# Patient Record
Sex: Male | Born: 1990 | Race: White | Hispanic: No | Marital: Single | Smoking: Current every day smoker
Health system: Southern US, Community
[De-identification: ages and names within clinical notes are randomized; demographics above are authoritative.]

## PROBLEM LIST (undated history)

## (undated) DIAGNOSIS — Z862 Personal history of diseases of the blood and blood-forming organs and certain disorders involving the immune mechanism: Secondary | ICD-10-CM

## (undated) DIAGNOSIS — S83249A Other tear of medial meniscus, current injury, unspecified knee, initial encounter: Secondary | ICD-10-CM

## (undated) HISTORY — PX: KNEE ARTHROSCOPY: SHX127

---
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
## 2010-08-02 ENCOUNTER — Encounter (HOSPITAL_COMMUNITY)
Admission: RE | Admit: 2010-08-02 | Discharge: 2010-08-02 | Disposition: A | Discharge status: Home / Self Care | Payer: 59 | Source: Ambulatory Visit | Attending: General Surgery | Admitting: General Surgery

## 2010-08-02 LAB — SURGICAL PCR SCREEN
MRSA, PCR: NEGATIVE
Staphylococcus aureus: NEGATIVE

## 2010-08-03 ENCOUNTER — Other Ambulatory Visit: Payer: Self-pay | Admitting: General Surgery

## 2010-08-03 ENCOUNTER — Ambulatory Visit (HOSPITAL_COMMUNITY)
Admission: RE | Admit: 2010-08-03 | Discharge: 2010-08-03 | Disposition: A | Discharge status: Home / Self Care | Payer: 59 | Source: Ambulatory Visit | Attending: General Surgery | Admitting: General Surgery

## 2010-08-03 DIAGNOSIS — L0591 Pilonidal cyst without abscess: Secondary | ICD-10-CM | POA: Insufficient documentation

## 2010-08-03 DIAGNOSIS — F17200 Nicotine dependence, unspecified, uncomplicated: Secondary | ICD-10-CM | POA: Insufficient documentation

## 2010-08-03 DIAGNOSIS — Z01812 Encounter for preprocedural laboratory examination: Secondary | ICD-10-CM | POA: Insufficient documentation

## 2010-08-03 HISTORY — PX: PILONIDAL CYST EXCISION: SHX744

## 2010-08-17 ENCOUNTER — Encounter (INDEPENDENT_AMBULATORY_CARE_PROVIDER_SITE_OTHER): Payer: Self-pay | Admitting: General Surgery

## 2010-08-17 NOTE — Op Note (Signed)
  NAMEBURTIS, Daryl Mejia                ACCOUNT NO.:  [id number]  MEDICAL RECORD NO.:  [id number]  LOCATION:  [facility]                         FACILITY:  [facility]  PHYSICIAN:  Cherylynn Ridges, M.D.    DATE OF BIRTH:  [birth year]  DATE OF PROCEDURE:  08/03/2010 DATE OF DISCHARGE:  08/03/2010                              OPERATIVE REPORT   PREOPERATIVE DIAGNOSIS:  Pilonidal cyst disease.  POSTOPERATIVE DIAGNOSIS:  Pilonidal cyst disease.  PROCEDURE:  Excision of pilonidal cyst disease.  SURGEON:  Cherylynn Ridges, MD  ANESTHESIA:  General endotracheal.  ESTIMATED BLOOD LOSS:  Less than 20 mL.  COMPLICATIONS:  None.  CONDITION:  Stable.  FINDINGS:  None.  DRAINING:  Pilonidal cyst disease in the upper intragluteal fold  INDICATIONS FOR OPERATION:  The patient is a 20 year old who has a symptomatic and previously-draining pilonidal cyst who now comes in for elective excision.  OPERATION:  The patient was taken to the operating room, placed on table in a supine position.  After an adequate general endotracheal anesthetic was administered, he was placed in the prone position and prepped and draped in usual sterile manner having his cheek separated by tape.  We marked the area of excision with a marking pen, then made our oval incision around the cystic area in the upper gluteal fold down below the lowest orifice leading into the cyst.  We excised it into the subcutaneous tissue using #10 blade. We used electrocautery to excise it circumferentially down to the presacral fascia.  This was done with minimal difficulty.  We did not enter the cyst cavity itself.  After it was removed on a separate field, we passed a probe into the opening into the cyst confirming its patency into the cyst.  We obtained hemostasis with electrocautery.  We irrigated with saline. We reapproximated the wound deeply subcutaneously with 2-0 Vicryl sutures, was superficially with 3-0 Vicryl, then the skin was  closed using a vertical mattress stitches of 2-0 nylon.  Marcaine 0.5% with epi was injected into the wound.  Prior to closure, all counts were correct. A sterile dressing was applied using Betadine ointment, 4x4s and an ABD.     Cherylynn Ridges, M.D.     JOW/MEDQ  D:  08/03/2010  T:  08/04/2010  Job:  [id number]  Electronically Signed by Jimmye Norman M.D. on 08/17/2010 08:09:05 AM

## 2010-09-09 ENCOUNTER — Ambulatory Visit (INDEPENDENT_AMBULATORY_CARE_PROVIDER_SITE_OTHER): Payer: 59 | Admitting: General Surgery

## 2010-09-09 DIAGNOSIS — Z5189 Encounter for other specified aftercare: Secondary | ICD-10-CM

## 2010-09-09 NOTE — Progress Notes (Signed)
The patient comes in for evaluation of his pilonidal wound at the upper aspect of this incision there is a firm area which is tender to palpation.  It is not erythematous, fluctuant, or draining any fluid. It does not appear to be infected. I do not think that antibiotics are necessary at this time . I believe this part of the normal healing process and the firmness in the upper portion of his incision may be secondary to a piece of suture material that is not quite as obvious on the skin.  I've recommended that he keep pressure off this area and come back to see me on a p.r.n. basis

## 2011-04-15 ENCOUNTER — Telehealth: Payer: Self-pay | Admitting: Internal Medicine

## 2011-04-15 NOTE — Telephone Encounter (Signed)
Referred by Dr. Deatra James Dx- Thrombocytopenia

## 2011-04-19 ENCOUNTER — Other Ambulatory Visit: Payer: Self-pay | Admitting: Internal Medicine

## 2011-04-19 DIAGNOSIS — D696 Thrombocytopenia, unspecified: Secondary | ICD-10-CM

## 2011-04-20 ENCOUNTER — Ambulatory Visit: Payer: 59 | Admitting: Lab

## 2011-04-20 ENCOUNTER — Ambulatory Visit: Payer: 59

## 2011-04-20 ENCOUNTER — Telehealth: Payer: Self-pay | Admitting: Internal Medicine

## 2011-04-20 ENCOUNTER — Other Ambulatory Visit (HOSPITAL_BASED_OUTPATIENT_CLINIC_OR_DEPARTMENT_OTHER): Payer: 59 | Admitting: Lab

## 2011-04-20 ENCOUNTER — Encounter: Payer: Self-pay | Admitting: Internal Medicine

## 2011-04-20 ENCOUNTER — Ambulatory Visit (HOSPITAL_BASED_OUTPATIENT_CLINIC_OR_DEPARTMENT_OTHER): Payer: 59 | Admitting: Internal Medicine

## 2011-04-20 VITALS — BP 109/72 | HR 70 | Temp 96.8°F | Ht 69.0 in | Wt 172.8 lb

## 2011-04-20 DIAGNOSIS — D696 Thrombocytopenia, unspecified: Secondary | ICD-10-CM

## 2011-04-20 LAB — CBC WITH DIFFERENTIAL/PLATELET
BASO%: 0.3 % (ref 0.0–2.0)
EOS%: 0.8 % (ref 0.0–7.0)
Eosinophils Absolute: 0.1 10*3/uL (ref 0.0–0.5)
HCT: 45.3 % (ref 38.4–49.9)
MCHC: 34.8 g/dL (ref 32.0–36.0)
MONO#: 0.5 10*3/uL (ref 0.1–0.9)
MONO%: 6.5 % (ref 0.0–14.0)
Platelets: 49 10*3/uL — ABNORMAL LOW (ref 140–400)
RBC: 5.18 10*6/uL (ref 4.20–5.82)
RDW: 12.4 % (ref 11.0–14.6)
lymph#: 1.9 10*3/uL (ref 0.9–3.3)

## 2011-04-20 LAB — COMPREHENSIVE METABOLIC PANEL
Albumin: 4.8 g/dL (ref 3.5–5.2)
Calcium: 9.3 mg/dL (ref 8.4–10.5)
Total Bilirubin: 0.7 mg/dL (ref 0.3–1.2)
Total Protein: 7.3 g/dL (ref 6.0–8.3)

## 2011-04-20 LAB — LACTATE DEHYDROGENASE: LDH: 182 U/L (ref 94–250)

## 2011-04-20 MED ORDER — PREDNISONE 5 MG PO TABS: ORAL_TABLET | ORAL | Status: DC

## 2011-04-20 MED ORDER — PREDNISONE 5 MG PO TABS: 20.0000 mg | ORAL_TABLET | Freq: Every day | ORAL | Status: DC

## 2011-04-20 NOTE — Telephone Encounter (Signed)
gv pt appts for feb including [place] for 2/22.

## 2011-04-20 NOTE — Progress Notes (Signed)
[facility] [phone]   Fax:([phone]  CONSULT NOTE  REASON FOR CONSULTATION:  21 years old white male with thrombocytopenia  HPI Daryl Mejia is a 21 y.o. male was no significant past medical history who a few weeks ago noticed some bruises and ecchymosis in his lower extremities. He was seen by his primary care physician and CBC was performed on 04/01/2011 and it showed platelets count was low at 49,000. The patient had a normal white blood count of 6.9, normal hemoglobin of 15.8 and hematocrit of 47.1%. He was referred to me today for evaluation and recommendation regarding his condition. He denied having any recent infection except for viral gastroenteritis at Christmas time. He denied using any over the counter medications specifically no ibuprofen, naproxen or aspirin. He he was a local thousand nutritional supplements for Body building and workout. He denied having any significant weight loss or night sweats. No chest pain or shortness of breath. No neurological abnormalities or kidney issues. He has no fever or chills. He denied having any other bleeding issues except for occasional gum bleed with brushing. The patient is single. 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 He is a current smoker and drinks 5-6 beers on the weekend. @SFHPI @  Past Medical History  Diagnosis Date  . Pneumonia     2007  . Migraine     hx of migraines in middle school    Past Surgical History  Procedure Date  . Knee surgery     2004/2005  . Back surgery     History reviewed. No pertinent family history.  Social History History  Substance Use Topics  . Smoking status: Current Some Day Smoker    Types: Cigarettes  . Smokeless tobacco: Not on file  . Alcohol Use: Yes    No Known Allergies  Current Outpatient Prescriptions  Medication Sig Dispense Refill  . doxycycline (VIBRA-TABS) 100 MG tablet         . HYDROcodone-acetaminophen (VICODIN) 5-500 MG per tablet       . predniSONE (DELTASONE) 5 MG tablet 20 mg tab (4 tab daily for a total of 80 mg daily).  60 tablet  0  . traMADol (ULTRAM) 50 MG tablet         Review of Systems  A comprehensive review of systems was negative except for: Constitutional: positive for fatigue Hematologic/lymphatic: positive for easy bruising  Physical Exam  HYQ:MVHQI, healthy, no distress, well nourished and well developed SKIN: skin color, texture, turgor are normal HEAD: Normocephalic, No masses, lesions, tenderness or abnormalities EYES: normal OROPHARYNX:no exudate, no erythema and lips, buccal mucosa, and tongue normal  NECK: supple, no adenopathy LYMPH:  no palpable lymphadenopathy, no hepatosplenomegaly LUNGS: clear to auscultation  HEART: regular rate & rhythm, no murmurs and no gallops ABDOMEN:abdomen soft, non-tender, normal bowel sounds and no masses or organomegaly EXTREMITIES:no joint deformities, effusion, or inflammation, no edema, no clubbing, no cyanosis, old area of ecchymosis.   NEURO: alert & oriented x 3 with fluent speech, no focal motor/sensory deficits    Studies/Results: No results found.   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 The patient has no symptoms or signs concerning for TTP and no schistocytes on his peripheral blood smear. This could also be drug-induced especially with the nutritional supplements that the patient has been using for body  building. I have a lengthy discussion with the patient and his parents today about his condition and treatment options.  PLAN: #1 I advised the patient to discontinue any nutritional supplements and to avoid using any over the counter medications at this point. #2 I ordered several studies today to evaluate the 20th of his thrombocytopenia including repeat CBC, comprehensive metabolic panel, LDH, hepatitis  panel, HIV, iron study, serum folate and vitamin B 12 level. #3 I ordered ultrasound of the abdomen to evaluate for hepatosplenomegaly. #4 I started the patient on prednisone 80 mg by mouth daily until I see him back next week for evaluation with repeat CBC. #5 I advised the patient to call me immediately if she has any concerning symptoms in the interval especially with any bleeding issues, petechiae or significant bruising. #6 advice addition to quit smoking and alcohol drinking. The patient and his family agreed to the current plan.  All questions were answered. The patient knows to call the clinic with any problems, questions or concerns. We can certainly see the patient much sooner if necessary.  Thank you so much for allowing me to participate in the care of Daryl Mejia. I will continue to follow up the patient with you and assist in his care.  I spent 30 minutes counseling the patient face to face. The total time spent in the appointment was 55 minutes.   Porfirio Bollier K. 04/20/2011, 5:36 PM

## 2011-04-21 LAB — HEPATITIS PANEL, ACUTE: Hep A IgM: NEGATIVE

## 2011-04-21 LAB — FOLATE: Folate: >20 ng/mL

## 2011-04-21 LAB — IRON AND TIBC: %SAT: 24 % (ref 20–55)

## 2011-04-21 LAB — VITAMIN B12: Vitamin B-12: 784 pg/mL (ref 211–911)

## 2011-04-21 LAB — HIV ANTIBODY (ROUTINE TESTING W REFLEX): HIV: NONREACTIVE

## 2011-04-22 ENCOUNTER — Ambulatory Visit (HOSPITAL_COMMUNITY)
Admission: RE | Admit: 2011-04-22 | Discharge: 2011-04-22 | Disposition: A | Discharge status: Home / Self Care | Payer: 59 | Source: Ambulatory Visit | Attending: Internal Medicine | Admitting: Internal Medicine

## 2011-04-22 DIAGNOSIS — D696 Thrombocytopenia, unspecified: Secondary | ICD-10-CM | POA: Insufficient documentation

## 2011-04-26 ENCOUNTER — Ambulatory Visit (HOSPITAL_BASED_OUTPATIENT_CLINIC_OR_DEPARTMENT_OTHER): Payer: 59 | Admitting: Internal Medicine

## 2011-04-26 ENCOUNTER — Other Ambulatory Visit: Payer: 59 | Admitting: Lab

## 2011-04-26 ENCOUNTER — Telehealth: Payer: Self-pay | Admitting: Internal Medicine

## 2011-04-26 VITALS — BP 136/67 | HR 82 | Temp 97.7°F | Ht 69.0 in | Wt 177.9 lb

## 2011-04-26 DIAGNOSIS — IMO0002 Reserved for concepts with insufficient information to code with codable children: Secondary | ICD-10-CM

## 2011-04-26 DIAGNOSIS — D693 Immune thrombocytopenic purpura: Secondary | ICD-10-CM

## 2011-04-26 DIAGNOSIS — D696 Thrombocytopenia, unspecified: Secondary | ICD-10-CM

## 2011-04-26 LAB — CBC WITH DIFFERENTIAL/PLATELET
LYMPH%: 7.6 % — ABNORMAL LOW (ref 14.0–49.0)
MCHC: 34.5 g/dL (ref 32.0–36.0)
MONO%: 3.3 % (ref 0.0–14.0)
NEUT%: 88.9 % — ABNORMAL HIGH (ref 39.0–75.0)
WBC: 15.1 10*3/uL — ABNORMAL HIGH (ref 4.0–10.3)
lymph#: 1.2 10*3/uL (ref 0.9–3.3)

## 2011-04-26 LAB — LACTATE DEHYDROGENASE: LDH: 177 U/L (ref 94–250)

## 2011-04-26 NOTE — Telephone Encounter (Signed)
gv pt appt for [id number] 

## 2011-04-26 NOTE — Progress Notes (Signed)
states he received a medication  as an  injection 2 years ago for a bacterial infection and broke out in hives. He cannot remember the name of the medication

## 2011-04-26 NOTE — Progress Notes (Signed)
[facility] Telephone:([phone]   Fax:([phone]  OFFICE PROGRESS NOTE  DIAGNOSIS: Idiopathic thrombocytopenic purpura (ITP)   PRIOR THERAPY: None  CURRENT THERAPY: Prednisone 80 mg by mouth daily.  INTERVAL HISTORY: Daryl Mejia 21 y.o. male returns to the clinic today for followup visit. The patient is feeling fine today with no specific complaints. He denied having any bleeding issues. No bruises or ecchymosis. The patient has several studies performed since his first visit last week. This including HIV testing that was negative, hepatitis panel was negative, ultrasound of the abdomen showed upper normal size the spleen and liver. The patient tolerated his treatment with prednisone fairly well.   MEDICAL HISTORY: Past Medical History  Diagnosis Date  . Pneumonia     2007  . Migraine     hx of migraines in middle school    ALLERGIES:   has no known allergies.  MEDICATIONS:  Current Outpatient Prescriptions  Medication Sig Dispense Refill  . predniSONE (DELTASONE) 5 MG tablet 20 mg tab (4 tab daily for a total of 80 mg daily).  60 tablet  0  . doxycycline (VIBRA-TABS) 100 MG tablet       . HYDROcodone-acetaminophen (VICODIN) 5-500 MG per tablet       . traMADol (ULTRAM) 50 MG tablet         SURGICAL HISTORY:  Past Surgical History  Procedure Date  . Knee surgery     2004/2005  . Back surgery     REVIEW OF SYSTEMS:  A comprehensive review of systems was negative.   PHYSICAL EXAMINATION: General appearance: alert, cooperative and no distress Neck: no adenopathy Lymph nodes: Cervical, supraclavicular, and axillary nodes normal. Resp: clear to auscultation bilaterally Cardio: regular rate and rhythm, S1, S2 normal, no murmur, click, rub or gallop GI: soft, non-tender; bowel sounds normal; no masses,  no organomegaly Extremities: extremities normal, atraumatic, no cyanosis or edema Neurologic: Alert and oriented X 3, normal strength and  tone. Normal symmetric reflexes. Normal coordination and gait  ECOG PERFORMANCE STATUS: 0 - Asymptomatic  Blood pressure 136/67, pulse 82, temperature 97.7 F (36.5 C), temperature source Oral, height 5\' 9"  (1.753 m), weight 177 lb 14.4 oz (80.695 kg).  LABORATORY DATA: Lab Results  Component Value Date   WBC 15.1* 04/26/2011   HGB 15.9 04/26/2011   HCT 46.1 04/26/2011   MCV 89.2 04/26/2011   PLT 173 04/26/2011      Chemistry      Component Value Date/Time   NA 141 04/20/2011 1307   K 3.8 04/20/2011 1307   CL 102 04/20/2011 1307   CO2 28 04/20/2011 1307   BUN 23 04/20/2011 1307   CREATININE 1.09 04/20/2011 1307      Component Value Date/Time   CALCIUM 9.3 04/20/2011 1307   ALKPHOS 59 04/20/2011 1307   AST 33 04/20/2011 1307   ALT 27 04/20/2011 1307   BILITOT 0.7 04/20/2011 1307       RADIOGRAPHIC STUDIES: US Abdomen Complete  04/22/2011  *RADIOLOGY REPORT*  Clinical Data:  Thrombocytopenia.  Rule out liver/splenic enlargement.  COMPLETE ABDOMINAL ULTRASOUND  Comparison:  None.  Findings:  Gallbladder:  No gallstones, gallbladder wall thickening, or pericholecystic fluid.  Common bile duct:  4.2 mm.  Liver:  Ultrasound technologist did not obtain a liver measurement. On the images submitted, the liver spans over approximately 14.7 cm.  No focal hepatic lesion.  IVC:  Appears normal.  Pancreas:  Obscured by bowel gas.  Spleen:  9.5 x 12.3 x 5.8 cm consistent with volume of 354 ml.  No focal mass.  Right Kidney:  10.6 cm. No hydronephrosis or renal mass.  Left Kidney:  11.0 cm. No hydronephrosis or renal mass.  Abdominal aorta:  No aneurysm identified.  IMPRESSION: Spleen and liver although top normal in size do not appear enlarged as noted above.  Original Report Authenticated By: Fuller Canada, M.D.    ASSESSMENT: This is a very pleasant 21 years old white male with idiopathic thrombocytopenic purpura currently on treatment with prednisone 1 mg/kg with significant improvement in his  platelets count. I discussed the lab result with the patient and gave him a copy of his report.  PLAN: I recommended for him to start tapering his prednisone dose starting tomorrow at 60 mg daily for one week, then down to 40 mg daily for another week. I would see the patient back for followup visit in 2 weeks for reevaluation and further recommendation regarding his treatment. He was advised to call me immediately if he has any questions having symptoms in the interval.  All questions were answered. The patient knows to call the clinic with any problems, questions or concerns. We can certainly see the patient much sooner if necessary.

## 2011-05-10 ENCOUNTER — Other Ambulatory Visit (HOSPITAL_BASED_OUTPATIENT_CLINIC_OR_DEPARTMENT_OTHER): Payer: 59 | Admitting: Lab

## 2011-05-10 ENCOUNTER — Telehealth: Payer: Self-pay | Admitting: Internal Medicine

## 2011-05-10 ENCOUNTER — Ambulatory Visit (HOSPITAL_BASED_OUTPATIENT_CLINIC_OR_DEPARTMENT_OTHER): Payer: 59 | Admitting: Internal Medicine

## 2011-05-10 ENCOUNTER — Other Ambulatory Visit: Payer: Self-pay | Admitting: Internal Medicine

## 2011-05-10 VITALS — BP 131/77 | HR 94 | Temp 97.2°F | Ht 69.0 in | Wt 174.3 lb

## 2011-05-10 DIAGNOSIS — D693 Immune thrombocytopenic purpura: Secondary | ICD-10-CM

## 2011-05-10 DIAGNOSIS — E039 Hypothyroidism, unspecified: Secondary | ICD-10-CM

## 2011-05-10 LAB — COMPREHENSIVE METABOLIC PANEL
ALT: 26 U/L (ref 0–53)
Calcium: 10 mg/dL (ref 8.4–10.5)
Creatinine, Ser: 1.08 mg/dL (ref 0.50–1.35)
Total Bilirubin: 0.5 mg/dL (ref 0.3–1.2)

## 2011-05-10 LAB — CBC WITH DIFFERENTIAL/PLATELET
BASO%: 0.1 % (ref 0.0–2.0)
Eosinophils Absolute: 0 10*3/uL (ref 0.0–0.5)
HCT: 45.5 % (ref 38.4–49.9)
HGB: 16.2 g/dL (ref 13.0–17.1)
MONO%: 1.3 % (ref 0.0–14.0)
NEUT%: 94.5 % — ABNORMAL HIGH (ref 39.0–75.0)
RBC: 5.35 10*6/uL (ref 4.20–5.82)
RDW: 12.8 % (ref 11.0–14.6)
WBC: 17.5 10*3/uL — ABNORMAL HIGH (ref 4.0–10.3)

## 2011-05-10 LAB — LACTATE DEHYDROGENASE: LDH: 178 U/L (ref 94–250)

## 2011-05-10 NOTE — Telephone Encounter (Signed)
appts called to father and they will p/u a new sch 3/13    aom

## 2011-05-10 NOTE — Progress Notes (Signed)
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 male returns to the clinic today for followup visit accompanied his mother and father. The patient is doing fine except for generalized fatigue. He denied having any bleeding bruises or ecchymosis. He is currently on prednisone 40 mg by mouth daily for the last week. He has repeat CBC performed earlier today and he is here for evaluation and discussion of his lab results.   MEDICAL HISTORY: Past Medical History  Diagnosis Date  . Pneumonia     2007  . Migraine     hx of migraines in middle school    ALLERGIES:  is allergic to phenergan.  MEDICATIONS:  Current Outpatient Prescriptions  Medication Sig Dispense Refill  . predniSONE (DELTASONE) 20 MG tablet 40 mg.        SURGICAL HISTORY:  Past Surgical History  Procedure Date  . Knee surgery     2004/2005  . Back surgery     REVIEW OF SYSTEMS:  A comprehensive review of systems was negative except for: Constitutional: positive for fatigue   PHYSICAL EXAMINATION: General appearance: alert, cooperative, fatigued and no distress Neck: no adenopathy Lymph nodes: Cervical, supraclavicular, and axillary nodes normal. Resp: clear to auscultation bilaterally Cardio: regular rate and rhythm, S1, S2 normal, no murmur, click, rub or gallop GI: soft, non-tender; bowel sounds normal; no masses,  no organomegaly Extremities: extremities normal, atraumatic, no cyanosis or edema Neurologic: Alert and oriented X 3, normal strength and tone. Normal symmetric reflexes. Normal coordination and gait  ECOG PERFORMANCE STATUS: 1 - Symptomatic but completely ambulatory  Blood pressure 131/77, pulse 94, temperature 97.2 F (36.2 C),  temperature source Oral, height 5\' 9"  (1.753 m), weight 174 lb 4.8 oz (79.062 kg).  LABORATORY DATA: Lab Results  Component Value Date   WBC 17.5* 05/10/2011   HGB 16.2 05/10/2011   HCT 45.5 05/10/2011   MCV 85.0 05/10/2011   PLT 49* 05/10/2011      Chemistry      Component Value Date/Time   NA 141 04/20/2011 1307   K 3.8 04/20/2011 1307   CL 102 04/20/2011 1307   CO2 28 04/20/2011 1307   BUN 23 04/20/2011 1307   CREATININE 1.09 04/20/2011 1307      Component Value Date/Time   CALCIUM 9.3 04/20/2011 1307   ALKPHOS 59 04/20/2011 1307   AST 33 04/20/2011 1307   ALT 27 04/20/2011 1307   BILITOT 0.7 04/20/2011 1307       RADIOGRAPHIC STUDIES: US Abdomen Complete  04/22/2011  *RADIOLOGY REPORT*  Clinical Data:  Thrombocytopenia.  Rule out liver/splenic enlargement.  COMPLETE ABDOMINAL ULTRASOUND  Comparison:  None.  Findings:  Gallbladder:  No gallstones, gallbladder wall thickening, or pericholecystic fluid.  Common bile duct:  4.2 mm.  Liver:  Ultrasound technologist did not obtain a liver measurement. On the images submitted, the liver spans over approximately 14.7 cm.  No focal hepatic lesion.  IVC:  Appears normal.  Pancreas:  Obscured by bowel gas.  Spleen:  9.5 x 12.3 x 5.8 cm consistent with volume of 354 ml.  No focal mass.  Right Kidney:  10.6 cm. No hydronephrosis or renal mass.  Left Kidney:  11.0 cm. No hydronephrosis or renal mass.  Abdominal aorta:  No aneurysm identified.  IMPRESSION: Spleen and liver although top normal in size do not appear enlarged as noted above.  Original Report Authenticated By: Fuller Canada, M.D.    ASSESSMENT:  This is a very pleasant 21 years old white male with immune thrombocytopenic purpura (ITP) recently treated with a tapered dose of prednisone was initial improvement in his platelet count and then declined again to his baseline 49,000.  PLAN: I have a lengthy discussion with the patient today about his treatment options. I gave him the option of  observation and treatment only if he becomes symptomatic or has platelets count less than 30,000 versus treatment with weekly Rituxan for 4 doses followed by observation or maintenance. I also discussed with the patient the option of splenectomy but this would be our last resort. The patient agreed to proceed with treatment with Rituxan. He would have a chemotherapy education class this week and expected to start the first dose of this treatment later this week. I discussed with the patient adverse effect of Rituxan specifically the hypersensitivity reaction and the patient would like to proceed with treatment as planned. For fatigue, this could be related to thyroid problem as part of the immune mediated process. I would although TSH today. If it is abnormal I will refer the patient back to his primary care physician for treatment.  He would come back for followup visit in one month for reevaluation. He was advised to call me immediately if he has any concerning symptoms in the interval.  All questions were answered. The patient knows to call the clinic with any problems, questions or concerns. We can certainly see the patient much sooner if necessary.  I spent 15 minutes counseling the patient face to face. The total time spent in the appointment was 30 minutes.

## 2011-05-11 ENCOUNTER — Other Ambulatory Visit: Payer: 59

## 2011-05-11 LAB — TSH: TSH: 0.366 u[IU]/mL (ref 0.350–4.500)

## 2011-05-12 ENCOUNTER — Other Ambulatory Visit: Payer: Self-pay | Admitting: Internal Medicine

## 2011-05-13 ENCOUNTER — Ambulatory Visit (HOSPITAL_BASED_OUTPATIENT_CLINIC_OR_DEPARTMENT_OTHER): Payer: 59

## 2011-05-13 VITALS — BP 128/71 | HR 90 | Temp 98.3°F

## 2011-05-13 DIAGNOSIS — Z5112 Encounter for antineoplastic immunotherapy: Secondary | ICD-10-CM

## 2011-05-13 DIAGNOSIS — D693 Immune thrombocytopenic purpura: Secondary | ICD-10-CM

## 2011-05-13 MED ORDER — SODIUM CHLORIDE 0.9 % IV SOLN
Freq: Once | INTRAVENOUS | Status: AC
  Administered 2011-05-13: 09:00:00 via INTRAVENOUS

## 2011-05-13 MED ORDER — ACETAMINOPHEN 325 MG PO TABS
650.0000 mg | ORAL_TABLET | Freq: Once | ORAL | Status: AC
  Administered 2011-05-13: 650 mg via ORAL

## 2011-05-13 MED ORDER — SODIUM CHLORIDE 0.9 % IV SOLN
375.0000 mg/m2 | Freq: Once | INTRAVENOUS | Status: AC
  Administered 2011-05-13: 700 mg via INTRAVENOUS
  Filled 2011-05-13: qty 70

## 2011-05-13 MED ORDER — DIPHENHYDRAMINE HCL 25 MG PO CAPS
50.0000 mg | ORAL_CAPSULE | Freq: Once | ORAL | Status: AC
  Administered 2011-05-13: 50 mg via ORAL

## 2011-05-13 NOTE — Progress Notes (Signed)
Start of 1st Dose Rituxan. Rate 23 for 11cc

## 2011-05-13 NOTE — Patient Instructions (Signed)
[facility] Discharge Instructions for Patients Receiving Chemotherapy  Today you received the following chemotherapy agents  Rituxan.  To help prevent nausea and vomiting after your treatment, we encourage you to take your nausea medication as prescribed by your physician.  If you develop nausea and vomiting that is not controlled by your nausea medication, call the clinic. If it is after clinic hours your family physician or the after hours number for the clinic or go to the Emergency Department.   BELOW ARE SYMPTOMS THAT SHOULD BE REPORTED IMMEDIATELY:  *FEVER GREATER THAN 100.5 F  *CHILLS WITH OR WITHOUT FEVER  NAUSEA AND VOMITING THAT IS NOT CONTROLLED WITH YOUR NAUSEA MEDICATION  *UNUSUAL SHORTNESS OF BREATH  *UNUSUAL BRUISING OR BLEEDING  TENDERNESS IN MOUTH AND THROAT WITH OR WITHOUT PRESENCE OF ULCERS  *URINARY PROBLEMS  *BOWEL PROBLEMS  UNUSUAL RASH Items with * indicate a potential emergency and should be followed up as soon as possible.  One of the nurses will contact you 24 hours after your treatment. Please let the nurse know about any problems that you may have experienced. Feel free to call the clinic you have any questions or concerns. The clinic phone number is [phone].   I have been informed and understand all the instructions given to me. I know to contact the clinic, my physician, or go to the Emergency Department if any problems should occur. I do not have any questions at this time, but understand that I may call the clinic during office hours   should I have any questions or need assistance in obtaining follow up care.    __________________________________________  _____________  __________ Signature of Patient or Authorized Representative            Date                   Time    __________________________________________ Nurse's Signature

## 2011-05-17 ENCOUNTER — Other Ambulatory Visit: Payer: Self-pay | Admitting: *Deleted

## 2011-05-17 ENCOUNTER — Telehealth: Payer: Self-pay | Admitting: *Deleted

## 2011-05-17 NOTE — Progress Notes (Signed)
Pt called stating that he noticed last night that he starting getting red splotches on his face and has been having possible issue with fevers, however, he states his thermometer is not reliable.  He states his temp last night was 100.1.  Per Dr Donnald Garre, pt can take 25mg  benadryl PO Q8H PRN to help with the rash, and to call back if he buys a new thermometer and his fever comes back.  Will also add for Adrena to see patient at 9:45am during his infusion appt on 05/19/11.  Onc tx sched filled out.  SLJ

## 2011-05-17 NOTE — Telephone Encounter (Signed)
PT. HAD CALL THIS OFFICE EARLIER TODAY. HE HAS A RASH ON HIS FACE. ALSO PT. HAS HAD A TEMPERATURE FOR A FEW DAYS OF 100. HE WAS INSTRUCTED TO TAKE BENADRYL FOR THE RASH AND MONITOR HIS TEMPERATURE. NO OTHER PROBLEMS OR QUESTIONS AT THIS TIME. PT. WILL CALL THIS OFFICE IF THE NEED ARISES.

## 2011-05-18 ENCOUNTER — Telehealth: Payer: Self-pay | Admitting: Internal Medicine

## 2011-05-18 NOTE — Telephone Encounter (Signed)
l/m that yes Daryl Mejia will see the pt in the tx room    aom

## 2011-05-19 ENCOUNTER — Ambulatory Visit (HOSPITAL_BASED_OUTPATIENT_CLINIC_OR_DEPARTMENT_OTHER): Payer: 59 | Admitting: Physician Assistant

## 2011-05-19 ENCOUNTER — Ambulatory Visit (HOSPITAL_BASED_OUTPATIENT_CLINIC_OR_DEPARTMENT_OTHER): Payer: 59

## 2011-05-19 ENCOUNTER — Other Ambulatory Visit (HOSPITAL_BASED_OUTPATIENT_CLINIC_OR_DEPARTMENT_OTHER): Payer: 59

## 2011-05-19 VITALS — BP 123/68 | HR 77 | Temp 97.1°F

## 2011-05-19 DIAGNOSIS — Z5112 Encounter for antineoplastic immunotherapy: Secondary | ICD-10-CM

## 2011-05-19 DIAGNOSIS — D693 Immune thrombocytopenic purpura: Secondary | ICD-10-CM

## 2011-05-19 LAB — LACTATE DEHYDROGENASE: LDH: 147 U/L (ref 94–250)

## 2011-05-19 LAB — CBC WITH DIFFERENTIAL/PLATELET
BASO%: 0.6 % (ref 0.0–2.0)
Basophils Absolute: 0 10e3/uL (ref 0.0–0.1)
EOS%: 2.2 % (ref 0.0–7.0)
Eosinophils Absolute: 0.1 10e3/uL (ref 0.0–0.5)
HCT: 41.8 % (ref 38.4–49.9)
HGB: 15.1 g/dL (ref 13.0–17.1)
LYMPH%: 32.1 % (ref 14.0–49.0)
MCH: 30.2 pg (ref 27.2–33.4)
MCHC: 36.1 g/dL — ABNORMAL HIGH (ref 32.0–36.0)
MCV: 83.6 fL (ref 79.3–98.0)
MONO#: 0.6 10e3/uL (ref 0.1–0.9)
MONO%: 10.3 % (ref 0.0–14.0)
NEUT#: 3 10e3/uL (ref 1.5–6.5)
NEUT%: 54.8 % (ref 39.0–75.0)
Platelets: 129 10e3/uL — ABNORMAL LOW (ref 140–400)
RBC: 5 10e6/uL (ref 4.20–5.82)
RDW: 12.6 % (ref 11.0–14.6)
WBC: 5.5 10e3/uL (ref 4.0–10.3)
lymph#: 1.8 10e3/uL (ref 0.9–3.3)

## 2011-05-19 MED ORDER — SODIUM CHLORIDE 0.9 % IV SOLN
375.0000 mg/m2 | Freq: Once | INTRAVENOUS | Status: AC
  Administered 2011-05-19: 700 mg via INTRAVENOUS
  Filled 2011-05-19: qty 70

## 2011-05-19 MED ORDER — ACETAMINOPHEN 325 MG PO TABS
650.0000 mg | ORAL_TABLET | Freq: Once | ORAL | Status: AC
  Administered 2011-05-19: 650 mg via ORAL

## 2011-05-19 MED ORDER — DIPHENHYDRAMINE HCL 25 MG PO CAPS
50.0000 mg | ORAL_CAPSULE | Freq: Once | ORAL | Status: AC
  Administered 2011-05-19: 50 mg via ORAL

## 2011-05-19 MED ORDER — SODIUM CHLORIDE 0.9 % IV SOLN
Freq: Once | INTRAVENOUS | Status: AC
  Administered 2011-05-19: 10:00:00 via INTRAVENOUS

## 2011-05-21 ENCOUNTER — Encounter: Payer: Self-pay | Admitting: Physician Assistant

## 2011-05-21 NOTE — Progress Notes (Signed)
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 male returns to the clinic today for followup visit accompanied his mother and girlfriend. He complained of breaking out in a red blood she rash on his face earlier this week. He is MAXIMUM TEMPERATURE of 100.1 not associated with any upper respiratory  symptomatology. Upon further questioning he feels he may have been a little short of breath when the rash developed that he feels this was more related to anxiety. He did have a "bad headache" after his treatment last Friday with the low-grade fever and some night sweats as well as some difficulty sleeping. Per instruction he took some Benadryl with improvement in his rash. Voiced no other complaints today.   MEDICAL HISTORY: Past Medical History  Diagnosis Date  . Pneumonia     2007  . Migraine     hx of migraines in middle school    ALLERGIES:  is allergic to phenergan.  MEDICATIONS:  Current Outpatient Prescriptions  Medication Sig Dispense Refill  . predniSONE (DELTASONE) 20 MG tablet 40 mg.        SURGICAL HISTORY:  Past Surgical History  Procedure Date  . Knee surgery     2004/2005  . Back surgery     REVIEW OF SYSTEMS:  A comprehensive review of systems was negative except for: Constitutional: positive for night sweats Integument/breast: positive for rash Neurological: positive for headaches   PHYSICAL EXAMINATION: General appearance: alert, cooperative, fatigued and no distress Neck: no adenopathy Lymph nodes: Cervical, supraclavicular, and axillary nodes normal. Resp: clear to auscultation bilaterally Cardio: regular rate and rhythm, S1, S2 normal, no murmur, click, rub or gallop GI:  soft, non-tender; bowel sounds normal; no masses,  no organomegaly Extremities: extremities normal, atraumatic, no cyanosis or edema Neurologic: Alert and oriented X 3, normal strength and tone. Normal symmetric reflexes. Normal coordination and gait Skin: Faint small areas of erythema on the cheeks above his beard line as well as on the right forearm. No vesicles or pustules noted no evidence of infection.  ECOG PERFORMANCE STATUS: 1 - Symptomatic but completely ambulatory  There were no vitals taken for this visit.  LABORATORY DATA: Lab Results  Component Value Date   WBC 5.5 05/19/2011   HGB 15.1 05/19/2011   HCT 41.8 05/19/2011   MCV 83.6 05/19/2011   PLT 129* 05/19/2011      Chemistry      Component Value Date/Time   NA 137 05/10/2011 1144   K 4.5 05/10/2011 1144   CL 102 05/10/2011 1144   CO2 23 05/10/2011 1144   BUN 23 05/10/2011 1144   CREATININE 1.08 05/10/2011 1144      Component Value Date/Time   CALCIUM 10.0 05/10/2011 1144   ALKPHOS 57 05/10/2011 1144   AST 27 05/10/2011 1144   ALT 26 05/10/2011 1144   BILITOT 0.5 05/10/2011 1144       RADIOGRAPHIC STUDIES: US Abdomen Complete  04/22/2011  *RADIOLOGY REPORT*  Clinical Data:  Thrombocytopenia.  Rule out liver/splenic enlargement.  COMPLETE ABDOMINAL ULTRASOUND  Comparison:  None.  Findings:  Gallbladder:  No gallstones, gallbladder wall thickening, or pericholecystic fluid.  Common bile duct:  4.2 mm.  Liver:  Ultrasound technologist did not obtain a liver  measurement. On the images submitted, the liver spans over approximately 14.7 cm.  No focal hepatic lesion.  IVC:  Appears normal.  Pancreas:  Obscured by bowel gas.  Spleen:  9.5 x 12.3 x 5.8 cm consistent with volume of 354 ml.  No focal mass.  Right Kidney:  10.6 cm. No hydronephrosis or renal mass.  Left Kidney:  11.0 cm. No hydronephrosis or renal mass.  Abdominal aorta:  No aneurysm identified.  IMPRESSION: Spleen and liver although top normal in size do not appear  enlarged as noted above.  Original Report Authenticated By: Fuller Canada, M.D.    ASSESSMENT/PLAN:  This is a very pleasant 22 years old white male with immune thrombocytopenic purpura (ITP) recently treated with a tapered dose of prednisone was initial improvement in his platelet count and then declined again to his baseline 49,000. He is currently being treated with weekly Rituxan at 375 mg per meter squared for a total of 4 planned doses, status post 1 cycle. The patient was discussed with Dr. Arbutus Ped. Overall he is doing well. Is not clear what the etiology of his rash was. We have asked him to keep benign on any further eruptions and to notify us in the event that may occur. He is to followup as previously scheduled with Dr. Arbutus Ped. He'll proceed with his second weekly cycle of Rituxan as scheduled.  Laural Benes, Wai Litt E, PA-C   All questions were answered. The patient knows to call the clinic with any problems, questions or concerns. We can certainly see the patient much sooner if necessary.

## 2011-05-26 ENCOUNTER — Other Ambulatory Visit (HOSPITAL_BASED_OUTPATIENT_CLINIC_OR_DEPARTMENT_OTHER): Payer: 59 | Admitting: Lab

## 2011-05-26 ENCOUNTER — Ambulatory Visit (HOSPITAL_BASED_OUTPATIENT_CLINIC_OR_DEPARTMENT_OTHER): Payer: 59

## 2011-05-26 VITALS — BP 107/58 | HR 62 | Temp 97.6°F

## 2011-05-26 DIAGNOSIS — Z5112 Encounter for antineoplastic immunotherapy: Secondary | ICD-10-CM

## 2011-05-26 DIAGNOSIS — D693 Immune thrombocytopenic purpura: Secondary | ICD-10-CM

## 2011-05-26 LAB — CBC WITH DIFFERENTIAL/PLATELET
Basophils Absolute: 0 10*3/uL (ref 0.0–0.1)
Eosinophils Absolute: 0.1 10*3/uL (ref 0.0–0.5)
HCT: 43.6 % (ref 38.4–49.9)
HGB: 15.5 g/dL (ref 13.0–17.1)
MONO#: 0.5 10*3/uL (ref 0.1–0.9)
NEUT#: 2.9 10*3/uL (ref 1.5–6.5)
RDW: 12.5 % (ref 11.0–14.6)
lymph#: 1.4 10*3/uL (ref 0.9–3.3)

## 2011-05-26 LAB — LACTATE DEHYDROGENASE: LDH: 134 U/L (ref 94–250)

## 2011-05-26 MED ORDER — SODIUM CHLORIDE 0.9 % IV SOLN
375.0000 mg/m2 | Freq: Once | INTRAVENOUS | Status: AC
  Administered 2011-05-26: 700 mg via INTRAVENOUS
  Filled 2011-05-26: qty 70

## 2011-05-26 MED ORDER — ACETAMINOPHEN 325 MG PO TABS
650.0000 mg | ORAL_TABLET | Freq: Once | ORAL | Status: AC
  Administered 2011-05-26: 650 mg via ORAL

## 2011-05-26 MED ORDER — SODIUM CHLORIDE 0.9 % IV SOLN
Freq: Once | INTRAVENOUS | Status: AC
  Administered 2011-05-26: 10:00:00 via INTRAVENOUS

## 2011-05-26 MED ORDER — DIPHENHYDRAMINE HCL 25 MG PO CAPS
50.0000 mg | ORAL_CAPSULE | Freq: Once | ORAL | Status: AC
  Administered 2011-05-26: 50 mg via ORAL

## 2011-05-26 NOTE — Progress Notes (Signed)
Patient ok to treat with Rituxan today with platelets at 97,per  verbal order by Dr. Arbutus Ped.

## 2011-05-26 NOTE — Patient Instructions (Signed)
Patient discharged home with no complaints. 

## 2011-05-30 ENCOUNTER — Other Ambulatory Visit: Payer: Self-pay | Admitting: Certified Registered Nurse Anesthetist

## 2011-06-02 ENCOUNTER — Other Ambulatory Visit (HOSPITAL_BASED_OUTPATIENT_CLINIC_OR_DEPARTMENT_OTHER): Payer: 59 | Admitting: Lab

## 2011-06-02 ENCOUNTER — Ambulatory Visit (HOSPITAL_BASED_OUTPATIENT_CLINIC_OR_DEPARTMENT_OTHER): Payer: 59

## 2011-06-02 VITALS — BP 104/48 | HR 67 | Temp 97.7°F

## 2011-06-02 DIAGNOSIS — D693 Immune thrombocytopenic purpura: Secondary | ICD-10-CM

## 2011-06-02 DIAGNOSIS — Z5112 Encounter for antineoplastic immunotherapy: Secondary | ICD-10-CM

## 2011-06-02 LAB — CBC WITH DIFFERENTIAL/PLATELET
BASO%: 0.3 % (ref 0.0–2.0)
Basophils Absolute: 0 10*3/uL (ref 0.0–0.1)
Eosinophils Absolute: 0.1 10*3/uL (ref 0.0–0.5)
HCT: 43.9 % (ref 38.4–49.9)
HGB: 15.9 g/dL (ref 13.0–17.1)
MCHC: 36.2 g/dL — ABNORMAL HIGH (ref 32.0–36.0)
MONO#: 0.7 10*3/uL (ref 0.1–0.9)
NEUT#: 3.6 10*3/uL (ref 1.5–6.5)
NEUT%: 59.2 % (ref 39.0–75.0)
WBC: 6.1 10*3/uL (ref 4.0–10.3)
lymph#: 1.7 10*3/uL (ref 0.9–3.3)

## 2011-06-02 LAB — LACTATE DEHYDROGENASE: LDH: 228 U/L (ref 94–250)

## 2011-06-02 MED ORDER — SODIUM CHLORIDE 0.9 % IV SOLN
375.0000 mg/m2 | Freq: Once | INTRAVENOUS | Status: AC
  Administered 2011-06-02: 700 mg via INTRAVENOUS
  Filled 2011-06-02: qty 70

## 2011-06-02 MED ORDER — DIPHENHYDRAMINE HCL 25 MG PO CAPS
50.0000 mg | ORAL_CAPSULE | Freq: Once | ORAL | Status: AC
  Administered 2011-06-02: 50 mg via ORAL

## 2011-06-02 MED ORDER — ACETAMINOPHEN 325 MG PO TABS
650.0000 mg | ORAL_TABLET | Freq: Once | ORAL | Status: AC
  Administered 2011-06-02: 650 mg via ORAL

## 2011-06-02 MED ORDER — SODIUM CHLORIDE 0.9 % IV SOLN
Freq: Once | INTRAVENOUS | Status: AC
  Administered 2011-06-02: 10:00:00 via INTRAVENOUS

## 2011-06-06 ENCOUNTER — Other Ambulatory Visit: Payer: Self-pay | Admitting: Certified Registered Nurse Anesthetist

## 2011-06-08 ENCOUNTER — Telehealth: Payer: Self-pay | Admitting: Internal Medicine

## 2011-06-08 ENCOUNTER — Ambulatory Visit (HOSPITAL_BASED_OUTPATIENT_CLINIC_OR_DEPARTMENT_OTHER): Payer: 59 | Admitting: Internal Medicine

## 2011-06-08 ENCOUNTER — Ambulatory Visit (HOSPITAL_BASED_OUTPATIENT_CLINIC_OR_DEPARTMENT_OTHER): Payer: 59 | Admitting: Lab

## 2011-06-08 ENCOUNTER — Other Ambulatory Visit: Payer: Self-pay | Admitting: Medical Oncology

## 2011-06-08 VITALS — BP 122/70 | HR 64 | Temp 97.2°F | Ht 69.0 in | Wt 175.9 lb

## 2011-06-08 DIAGNOSIS — D693 Immune thrombocytopenic purpura: Secondary | ICD-10-CM

## 2011-06-08 LAB — CBC WITH DIFFERENTIAL/PLATELET
Basophils Absolute: 0 10*3/uL (ref 0.0–0.1)
EOS%: 1.4 % (ref 0.0–7.0)
HCT: 42 % (ref 38.4–49.9)
HGB: 15.3 g/dL (ref 13.0–17.1)
LYMPH%: 29.4 % (ref 14.0–49.0)
MCH: 29.9 pg (ref 27.2–33.4)
MONO#: 0.6 10*3/uL (ref 0.1–0.9)
NEUT%: 60.7 % (ref 39.0–75.0)
Platelets: 121 10*3/uL — ABNORMAL LOW (ref 140–400)
lymph#: 2.1 10*3/uL (ref 0.9–3.3)

## 2011-06-08 NOTE — Progress Notes (Signed)
      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 2) status post 4 weekly doses of Rituxan 375 mg/M2, last dose was given on 06/02/2011.  CURRENT THERAPY: None  INTERVAL HISTORY: Daryl Mejia 21 y.o. male returns to the clinic today for followup visit accompanied by his father, mother and his girlfriend. The patient tolerated the previous 4 weekly doses of Rituxan fairly well with no significant complaints. He denied having any significant chest pain or shortness of breath. No weight loss or night sweats. He has no bleeding issues. He has repeat CBC performed earlier today and he is here for evaluation and discussion of his lab results.  MEDICAL HISTORY: Past Medical History  Diagnosis Date  . Pneumonia     2007  . Migraine     hx of migraines in middle school    ALLERGIES:  is allergic to phenergan.  MEDICATIONS:  No current outpatient prescriptions on file.    SURGICAL HISTORY:  Past Surgical History  Procedure Date  . Knee surgery     2004/2005  . Back surgery     REVIEW OF SYSTEMS:  A comprehensive review of systems was negative.   PHYSICAL EXAMINATION: General appearance: alert, cooperative and no distress Neck: no adenopathy Lymph nodes: Cervical, supraclavicular, and axillary nodes normal. Resp: clear to auscultation bilaterally Cardio: regular rate and rhythm, S1, S2 normal, no murmur, click, rub or gallop GI: soft, non-tender; bowel sounds normal; no masses,  no organomegaly Extremities: extremities normal, atraumatic, no cyanosis or edema  ECOG PERFORMANCE STATUS: 0 - Asymptomatic  Blood pressure 122/70, pulse 64, temperature 97.2 F (36.2 C), temperature source Oral, height 5\' 9"  (1.753 m), weight 175 lb 14.4 oz (79.788  kg).  LABORATORY DATA: Lab Results  Component Value Date   WBC 7.3 06/08/2011   HGB 15.3 06/08/2011   HCT 42.0 06/08/2011   MCV 82.2 06/08/2011   PLT 121* 06/08/2011      Chemistry      Component Value Date/Time   NA 137 05/10/2011 1144   K 4.5 05/10/2011 1144   CL 102 05/10/2011 1144   CO2 23 05/10/2011 1144   BUN 23 05/10/2011 1144   CREATININE 1.08 05/10/2011 1144      Component Value Date/Time   CALCIUM 10.0 05/10/2011 1144   ALKPHOS 57 05/10/2011 1144   AST 27 05/10/2011 1144   ALT 26 05/10/2011 1144   BILITOT 0.5 05/10/2011 1144       RADIOGRAPHIC STUDIES: No results found.  ASSESSMENT: This is a very pleasant 21 years old white male with immune thrombocytopenic purpura refractory to steroids. He status post 4 weekly doses of Rituxan with significant improvement in his platelet count.  PLAN: I discussed the lab result with the patient and his family today.  I recommended for him continuous observation for now with repeat CBC on a biweekly basis. I would see him back for followup visit in one month's for reevaluation. He was advised to call me immediately if he has any concerning symptoms in the interval.  All questions were answered. The patient knows to call the clinic with any problems, questions or concerns. We can certainly see the patient much sooner if necessary.

## 2011-06-08 NOTE — Telephone Encounter (Signed)
gve the pt hiS April,MAY 2013 APPT CALENDAR

## 2011-06-22 ENCOUNTER — Other Ambulatory Visit (HOSPITAL_BASED_OUTPATIENT_CLINIC_OR_DEPARTMENT_OTHER): Payer: 59 | Admitting: Lab

## 2011-06-22 DIAGNOSIS — D693 Immune thrombocytopenic purpura: Secondary | ICD-10-CM

## 2011-06-22 LAB — CBC WITH DIFFERENTIAL/PLATELET
Eosinophils Absolute: 0.1 10*3/uL (ref 0.0–0.5)
HCT: 43.9 % (ref 38.4–49.9)
LYMPH%: 24 % (ref 14.0–49.0)
MONO#: 0.6 10*3/uL (ref 0.1–0.9)
NEUT#: 4.8 10*3/uL (ref 1.5–6.5)
NEUT%: 66.4 % (ref 39.0–75.0)
Platelets: 126 10*3/uL — ABNORMAL LOW (ref 140–400)
WBC: 7.3 10*3/uL (ref 4.0–10.3)

## 2011-07-06 ENCOUNTER — Ambulatory Visit (HOSPITAL_BASED_OUTPATIENT_CLINIC_OR_DEPARTMENT_OTHER): Payer: 59 | Admitting: Internal Medicine

## 2011-07-06 ENCOUNTER — Other Ambulatory Visit (HOSPITAL_BASED_OUTPATIENT_CLINIC_OR_DEPARTMENT_OTHER): Payer: 59 | Admitting: Lab

## 2011-07-06 ENCOUNTER — Telehealth: Payer: Self-pay | Admitting: Internal Medicine

## 2011-07-06 VITALS — BP 125/77 | HR 70 | Temp 97.1°F | Ht 69.0 in | Wt 178.4 lb

## 2011-07-06 DIAGNOSIS — D693 Immune thrombocytopenic purpura: Secondary | ICD-10-CM

## 2011-07-06 LAB — CBC WITH DIFFERENTIAL/PLATELET
Basophils Absolute: 0 10*3/uL (ref 0.0–0.1)
EOS%: 1.2 % (ref 0.0–7.0)
HCT: 42.4 % (ref 38.4–49.9)
HGB: 15.3 g/dL (ref 13.0–17.1)
MCH: 30.1 pg (ref 27.2–33.4)
MCV: 83.3 fL (ref 79.3–98.0)
MONO%: 11.5 % (ref 0.0–14.0)
NEUT%: 59.9 % (ref 39.0–75.0)
Platelets: 115 10*3/uL — ABNORMAL LOW (ref 140–400)

## 2011-07-06 NOTE — Progress Notes (Signed)
      [facility] Telephone:([phone]   Fax:(336) [phone]  OFFICE PROGRESS NOTE  [name], MD, MD [address] 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  2) status post 4 weekly doses of Rituxan 375 mg/M2, last dose was given on 06/02/2011.   CURRENT THERAPY: None   INTERVAL HISTORY: Daryl Mejia 21 y.o. male returns to the clinic today for followup visit accompanied his mother and her friend. The patient is feeling fine today with no specific complaints. He denied having any bleeding issues, bruises or ecchymosis. No significant weight loss or night sweats. He has repeat CBC performed earlier today and is here for evaluation and discussion of his lab results.  MEDICAL HISTORY: Past Medical History  Diagnosis Date  . Pneumonia     2007  . Migraine     hx of migraines in middle school    ALLERGIES:  is allergic to promethazine hcl.  MEDICATIONS:  No current outpatient prescriptions on file.    SURGICAL HISTORY:  Past Surgical History  Procedure Date  . Knee surgery     2004/2005  . Back surgery     REVIEW OF SYSTEMS:  A comprehensive review of systems was negative.   PHYSICAL EXAMINATION: General appearance: alert, cooperative and no distress Neck: no adenopathy Lymph nodes: Cervical, supraclavicular, and axillary nodes normal. Resp: clear to auscultation bilaterally Cardio: regular rate and rhythm, S1, S2 normal, no murmur, click, rub or gallop GI: soft, non-tender; bowel sounds normal; no masses,  no organomegaly Extremities: extremities normal, atraumatic, no cyanosis or edema  ECOG PERFORMANCE STATUS: 0 - Asymptomatic  Blood pressure 125/77, pulse 70, temperature 97.1 F (36.2 C), temperature source Oral, height 5\' 9"  (1.753 m), weight 178 lb 6.4 oz (80.922 kg).  LABORATORY DATA: Lab Results  Component Value Date   WBC 6.9  07/06/2011   HGB 15.3 07/06/2011   HCT 42.4 07/06/2011   MCV 83.3 07/06/2011   PLT 115* 07/06/2011      Chemistry      Component Value Date/Time   NA 137 05/10/2011 1144   K 4.5 05/10/2011 1144   CL 102 05/10/2011 1144   CO2 23 05/10/2011 1144   BUN 23 05/10/2011 1144   CREATININE 1.08 05/10/2011 1144      Component Value Date/Time   CALCIUM 10.0 05/10/2011 1144   ALKPHOS 57 05/10/2011 1144   AST 27 05/10/2011 1144   ALT 26 05/10/2011 1144   BILITOT 0.5 05/10/2011 1144       RADIOGRAPHIC STUDIES: No results found.  ASSESSMENT: This is a very pleasant 21 years old white male with history of immune thrombocytopenic purpura. She status post treatment with Rituxan last dose was given 06/02/2011. Patient is currently on observation. His platelets count is still low but stable.  PLAN: I recommended for him continuous observation for now with repeat CBC and LDH in one month. The patient was advised to call me immediately if he as any concerning symptoms in the interval the suspicion any bleeding issues bruises or ecchymosis.  All questions were answered. The patient knows to call the clinic with any problems, questions or concerns. We can certainly see the patient much sooner if necessary.

## 2011-07-06 NOTE — Telephone Encounter (Signed)
Gv pt appt for [id number] 

## 2011-08-02 ENCOUNTER — Encounter (HOSPITAL_COMMUNITY): Payer: Self-pay

## 2011-08-02 ENCOUNTER — Emergency Department (HOSPITAL_COMMUNITY)
Admission: EM | Admit: 2011-08-02 | Discharge: 2011-08-03 | Disposition: A | Discharge status: Home / Self Care | Payer: 59 | Attending: Emergency Medicine | Admitting: Emergency Medicine

## 2011-08-02 DIAGNOSIS — D696 Thrombocytopenia, unspecified: Secondary | ICD-10-CM | POA: Insufficient documentation

## 2011-08-02 DIAGNOSIS — N451 Epididymitis: Secondary | ICD-10-CM

## 2011-08-02 DIAGNOSIS — N509 Disorder of male genital organs, unspecified: Secondary | ICD-10-CM | POA: Insufficient documentation

## 2011-08-02 DIAGNOSIS — N433 Hydrocele, unspecified: Secondary | ICD-10-CM | POA: Insufficient documentation

## 2011-08-02 DIAGNOSIS — N453 Epididymo-orchitis: Secondary | ICD-10-CM | POA: Insufficient documentation

## 2011-08-02 DIAGNOSIS — N5089 Other specified disorders of the male genital organs: Secondary | ICD-10-CM | POA: Insufficient documentation

## 2011-08-02 MED ORDER — KETOROLAC TROMETHAMINE 60 MG/2ML IM SOLN
60.0000 mg | Freq: Once | INTRAMUSCULAR | Status: AC
  Administered 2011-08-03: 60 mg via INTRAMUSCULAR
  Filled 2011-08-02: qty 2

## 2011-08-02 MED ORDER — CEFTRIAXONE SODIUM 250 MG IJ SOLR: 250.0000 mg | Freq: Once | INTRAMUSCULAR | Status: DC

## 2011-08-02 NOTE — ED Notes (Signed)
Pt states he felt a little pain in his groin about one week ago, then he noticed his right testicle swollen two days ago

## 2011-08-02 NOTE — ED Provider Notes (Signed)
History     CSN: [id number]  Arrival date & time 08/02/11  2309   First MD Initiated Contact with Patient 08/02/11 2348      Chief Complaint  Patient presents with  . Groin Swelling    (Consider location/radiation/quality/duration/timing/severity/associated sxs/prior treatment) HPI Comments: 21 year old male history of recent diagnosis of unknown cause of thrombocytopenia - with counts that he reports just below 50,000. He states that approximately one week ago he started having a little bit of pain in the right inguinal area followed by 2 days ago noticing that his right testicle was swollen with a lump on the back. This is persistent, painful to palpation, walking and sitting and not associated with fevers chills urethral discharge nausea or vomiting. He does note having some blood in his ejaculate for the last couple of days. He is currently undergoing treatment for his thrombocytopenia and is supposed to followup with the hematologist tomorrow.  The history is provided by the patient and a friend.    Past Medical History  Diagnosis Date  . Pneumonia     2007  . Migraine     hx of migraines in middle school    Past Surgical History  Procedure Date  . Knee surgery     2004/2005  . Back surgery     History reviewed. No pertinent family history.  History  Substance Use Topics  . Smoking status: Current Some Day Smoker    Types: Cigarettes  . Smokeless tobacco: Not on file  . Alcohol Use: Yes      Review of Systems  Constitutional: Negative for fever and chills.  Gastrointestinal: Negative for nausea, vomiting, diarrhea, constipation and blood in stool.  Genitourinary: Positive for scrotal swelling and testicular pain. Negative for dysuria, frequency, flank pain, discharge, penile swelling, difficulty urinating, genital sores and penile pain.    Allergies  Promethazine hcl  Home Medications   Current Outpatient Rx  Name Route Sig Dispense Refill  .  DOXYCYCLINE HYCLATE 100 MG PO CAPS Oral Take 1 capsule (100 mg total) by mouth 2 (two) times daily. 20 capsule 0  . NAPROXEN 500 MG PO TABS Oral Take 1 tablet (500 mg total) by mouth 2 (two) times daily with a meal. 30 tablet 0  . TRAMADOL HCL 50 MG PO TABS Oral Take 1 tablet (50 mg total) by mouth every 6 (six) hours as needed for pain. 15 tablet 0    BP 113/54  Pulse 81  Temp(Src) 98.7 F (37.1 C) (Oral)  Resp 18  SpO2 98%  Physical Exam  Nursing note and vitals reviewed. Constitutional: He appears well-developed and well-nourished.  HENT:  Head: Normocephalic and atraumatic.  Eyes: Conjunctivae are normal. No scleral icterus.  Cardiovascular: Normal rate and regular rhythm.   Pulmonary/Chest: Effort normal and breath sounds normal.  Abdominal: Soft. Bowel sounds are normal. He exhibits no distension and no mass. There is no tenderness. There is no rebound and no guarding.  Genitourinary:       Genital exam shows normal appearing penis without source, circumcised, testicles on the left appears normal, nontender, normal cremasteric reflex, testicle on the right is swollen, mild erythema, nontender scrotum but tender testicle with a swollen epididymis. Normal cremasteric reflex on the right. No urethral discharge  Neurological: He is alert. Coordination normal.  Skin: Skin is warm and dry.    ED Course  Procedures (including critical care time)  Labs Reviewed  CBC - Abnormal; Notable for the following:    MCHC  36.3 (*)    All other components within normal limits  URINALYSIS, ROUTINE W REFLEX MICROSCOPIC - Abnormal; Notable for the following:    Hgb urine dipstick TRACE (*)    Leukocytes, UA SMALL (*)    All other components within normal limits  DIFFERENTIAL  URINE MICROSCOPIC-ADD ON  GC/CHLAMYDIA PROBE AMP, GENITAL   US Scrotum  08/03/2011  *RADIOLOGY REPORT*  Clinical Data:  Groin swelling and pain.  SCROTAL ULTRASOUND DOPPLER ULTRASOUND OF THE TESTICLES  Technique:  Complete ultrasound examination of the testicles, epididymis, and other scrotal structures was performed.  Color and spectral Doppler ultrasound were also utilized to evaluate blood flow to the testicles.  Comparison:  No priors.  Findings:  Right testis:  4.8 x 2.6 x 3.5 cm.  Normal in echotexture and appearance.  Left testis:  4.1 x 2.3 x 3.9 cm.  Normal in echotexture and appearance.  Right epididymis:  Appears diffusely enlarged measuring 2.7 x 1.6 x 2.9 cm with hypervascular flow on color Doppler imaging.  Left epididymis:  Normal in size measuring 1.2 x 1.0 x 0.5 cm, with normal flow on color Doppler imaging.  Hydocele:  Trace right-sided hydrocele.  No left-sided hydrocele.  Varicocele:  Absent  Pulsed Doppler interrogation of both testes demonstrates low resistance flow bilaterally.  IMPRESSION: 1.  Findings, as above, consistent with right-sided epididymitis with a trace right-sided hydrocele. 2.  The appearance of the testicles is normal bilaterally.  Original Report Authenticated By: Florencia Reasons, M.D.   [place] Art/ven Flow Abd Pelv Doppler  08/03/2011  *RADIOLOGY REPORT*  Clinical Data:  Groin swelling and pain.  SCROTAL ULTRASOUND DOPPLER ULTRASOUND OF THE TESTICLES  Technique: Complete ultrasound examination of the testicles, epididymis, and other scrotal structures was performed.  Color and spectral Doppler ultrasound were also utilized to evaluate blood flow to the testicles.  Comparison:  No priors.  Findings:  Right testis:  4.8 x 2.6 x 3.5 cm.  Normal in echotexture and appearance.  Left testis:  4.1 x 2.3 x 3.9 cm.  Normal in echotexture and appearance.  Right epididymis:  Appears diffusely enlarged measuring 2.7 x 1.6 x 2.9 cm with hypervascular flow on color Doppler imaging.  Left epididymis:  Normal in size measuring 1.2 x 1.0 x 0.5 cm, with normal flow on color Doppler imaging.  Hydocele:  Trace right-sided hydrocele.  No left-sided hydrocele.  Varicocele:  Absent  Pulsed Doppler  interrogation of both testes demonstrates low resistance flow bilaterally.  IMPRESSION: 1.  Findings, as above, consistent with right-sided epididymitis with a trace right-sided hydrocele. 2.  The appearance of the testicles is normal bilaterally.  Original Report Authenticated By: Florencia Reasons, M.D.     1. Epididymitis, right       MDM  Likely epididymitis, consider other sources given the patient's thrombocytopenia and being on a new medication Rituxan.  Check ultrasound, intramuscular Toradol, ice, GC Chlamydia swab.   Ultrasound confirms epididymitis, no other significant findings, vital signs normal without fever or tachycardia, medications given in emergency department including Rocephin intramuscular. Patient informed of his results including a normal platelet level and referred to urology.  Discharge Prescriptions include:  Doxycycline Tramadol Naprosyn     Vida Roller, MD 08/03/11 [phone]

## 2011-08-03 ENCOUNTER — Other Ambulatory Visit: Payer: 59 | Admitting: Lab

## 2011-08-03 ENCOUNTER — Ambulatory Visit (HOSPITAL_BASED_OUTPATIENT_CLINIC_OR_DEPARTMENT_OTHER): Payer: 59 | Admitting: Internal Medicine

## 2011-08-03 ENCOUNTER — Emergency Department (HOSPITAL_COMMUNITY): Payer: 59

## 2011-08-03 ENCOUNTER — Telehealth: Payer: Self-pay | Admitting: Internal Medicine

## 2011-08-03 VITALS — BP 126/69 | HR 84 | Temp 97.4°F | Ht 69.0 in | Wt 180.5 lb

## 2011-08-03 DIAGNOSIS — D693 Immune thrombocytopenic purpura: Secondary | ICD-10-CM

## 2011-08-03 LAB — URINALYSIS, ROUTINE W REFLEX MICROSCOPIC
Ketones, ur: NEGATIVE mg/dL
Nitrite: NEGATIVE
Protein, ur: NEGATIVE mg/dL

## 2011-08-03 LAB — GC/CHLAMYDIA PROBE AMP, GENITAL: Chlamydia, DNA Probe: NEGATIVE

## 2011-08-03 LAB — DIFFERENTIAL
Basophils Absolute: 0 10*3/uL (ref 0.0–0.1)
Basophils Relative: 0 % (ref 0–1)
Lymphocytes Relative: 28 % (ref 12–46)
Monocytes Relative: 11 % (ref 3–12)
Neutro Abs: 5.3 10*3/uL (ref 1.7–7.7)
Neutrophils Relative %: 60 % (ref 43–77)

## 2011-08-03 LAB — CBC
Hemoglobin: 14.7 g/dL (ref 13.0–17.0)
MCHC: 36.3 g/dL — ABNORMAL HIGH (ref 30.0–36.0)
RDW: 12.7 % (ref 11.5–15.5)
WBC: 8.8 10*3/uL (ref 4.0–10.5)

## 2011-08-03 MED ORDER — NAPROXEN 500 MG PO TABS: 500.0000 mg | ORAL_TABLET | Freq: Two times a day (BID) | ORAL | Status: DC

## 2011-08-03 MED ORDER — CEFTRIAXONE SODIUM 250 MG IJ SOLR
250.0000 mg | Freq: Once | INTRAMUSCULAR | Status: AC
  Administered 2011-08-03: 250 mg via INTRAMUSCULAR
  Filled 2011-08-03: qty 250

## 2011-08-03 MED ORDER — LIDOCAINE HCL 1 % IJ SOLN
INTRAMUSCULAR | Status: AC
  Administered 2011-08-03: 2 mL
  Filled 2011-08-03: qty 20

## 2011-08-03 MED ORDER — TRAMADOL HCL 50 MG PO TABS
50.0000 mg | ORAL_TABLET | Freq: Four times a day (QID) | ORAL | Status: AC | PRN
Start: 2011-08-03 — End: 2011-08-13

## 2011-08-03 MED ORDER — DOXYCYCLINE HYCLATE 100 MG PO CAPS: 100.0000 mg | ORAL_CAPSULE | Freq: Two times a day (BID) | ORAL | Status: AC

## 2011-08-03 NOTE — Progress Notes (Signed)
[facility] Telephone:(336) [phone]   Fax:([phone]  OFFICE PROGRESS NOTE  [name], MD, MD [address] 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  2) status post 4 weekly doses of Rituxan 375 mg/M2, last dose was given on 06/02/2011.   CURRENT THERAPY: None   INTERVAL HISTORY: Daryl Mejia 21 y.o. male returns to the clinic today for followup visit accompanied by his father, mother and girlfriend. The patient is feeling fine today with no specific complaints. He was seen at the emergency department last night with completion of epididymitis. He was started on treatment with Toradol and doxycycline. He has CBC performed yesterday that showed platelets count was normal at 156,000. The patient is here today for his routine evaluation.   MEDICAL HISTORY: Past Medical History  Diagnosis Date  . Pneumonia     2007  . Migraine     hx of migraines in middle school    ALLERGIES:  is allergic to promethazine hcl.  MEDICATIONS:  Current Outpatient Prescriptions  Medication Sig Dispense Refill  . doxycycline (VIBRAMYCIN) 100 MG capsule Take 1 capsule (100 mg total) by mouth 2 (two) times daily.  20 capsule  0  . naproxen (NAPROSYN) 500 MG tablet Take 1 tablet (500 mg total) by mouth 2 (two) times daily with a meal.  30 tablet  0  . traMADol (ULTRAM) 50 MG tablet Take 1 tablet (50 mg total) by mouth every 6 (six) hours as needed for pain.  15 tablet  0   No current facility-administered medications for this visit.   Facility-Administered Medications Ordered in Other Visits  Medication Dose Route Frequency Provider Last Rate Last Dose  . cefTRIAXone (ROCEPHIN) injection 250 mg  250 mg Intramuscular Once Vida Roller, MD   250 mg at 08/03/11 0201  . ketorolac (TORADOL) injection 60 mg  60 mg Intramuscular Once Vida Roller, MD   60 mg at 08/03/11 0008   . lidocaine (XYLOCAINE) 1 % (with pres) injection        2 mL at 08/03/11 0218  . DISCONTD: cefTRIAXone (ROCEPHIN) injection 250 mg  250 mg Intramuscular Once Vida Roller, MD        SURGICAL HISTORY:  Past Surgical History  Procedure Date  . Knee surgery     2004/2005  . Back surgery     REVIEW OF SYSTEMS:  A comprehensive review of systems was negative.   PHYSICAL EXAMINATION: General appearance: alert, cooperative and no distress Neck: no adenopathy Lymph nodes: Cervical, supraclavicular, and axillary nodes normal. Resp: clear to auscultation bilaterally Cardio: regular rate and rhythm, S1, S2 normal, no murmur, click, rub or gallop GI: soft, non-tender; bowel sounds normal; no masses,  no organomegaly Extremities: extremities normal, atraumatic, no cyanosis or edema  ECOG PERFORMANCE STATUS: 0 - Asymptomatic  Blood pressure 126/69, pulse 84, temperature 97.4 F (36.3 C), temperature source Oral, height 5\' 9"  (1.753 m), weight 180 lb 8 oz (81.874 kg).  LABORATORY DATA: Lab Results  Component Value Date   WBC 8.8 08/01/2011   HGB 14.7 08/01/2011   HCT 40.5 08/01/2011   MCV 84.6 08/01/2011   PLT 156 08/01/2011      Chemistry      Component Value Date/Time   NA 137 05/10/2011 1144   K 4.5 05/10/2011 1144   CL 102 05/10/2011 1144   CO2 23 05/10/2011  1144   BUN 23 05/10/2011 1144   CREATININE 1.08 05/10/2011 1144      Component Value Date/Time   CALCIUM 10.0 05/10/2011 1144   ALKPHOS 57 05/10/2011 1144   AST 27 05/10/2011 1144   ALT 26 05/10/2011 1144   BILITOT 0.5 05/10/2011 1144       RADIOGRAPHIC STUDIES: US Scrotum  08/03/2011  *RADIOLOGY REPORT*  Clinical Data:  Groin swelling and pain.  SCROTAL ULTRASOUND DOPPLER ULTRASOUND OF THE TESTICLES  Technique: Complete ultrasound examination of the testicles, epididymis, and other scrotal structures was performed.  Color and spectral Doppler ultrasound were also utilized to evaluate blood flow to the testicles.  Comparison:  No  priors.  Findings:  Right testis:  4.8 x 2.6 x 3.5 cm.  Normal in echotexture and appearance.  Left testis:  4.1 x 2.3 x 3.9 cm.  Normal in echotexture and appearance.  Right epididymis:  Appears diffusely enlarged measuring 2.7 x 1.6 x 2.9 cm with hypervascular flow on color Doppler imaging.  Left epididymis:  Normal in size measuring 1.2 x 1.0 x 0.5 cm, with normal flow on color Doppler imaging.  Hydocele:  Trace right-sided hydrocele.  No left-sided hydrocele.  Varicocele:  Absent  Pulsed Doppler interrogation of both testes demonstrates low resistance flow bilaterally.  IMPRESSION: 1.  Findings, as above, consistent with right-sided epididymitis with a trace right-sided hydrocele. 2.  The appearance of the testicles is normal bilaterally.  Original Report Authenticated By: Florencia Reasons, M.D.   [place] Art/ven Flow Abd Pelv Doppler  08/03/2011  *RADIOLOGY REPORT*  Clinical Data:  Groin swelling and pain.  SCROTAL ULTRASOUND DOPPLER ULTRASOUND OF THE TESTICLES  Technique: Complete ultrasound examination of the testicles, epididymis, and other scrotal structures was performed.  Color and spectral Doppler ultrasound were also utilized to evaluate blood flow to the testicles.  Comparison:  No priors.  Findings:  Right testis:  4.8 x 2.6 x 3.5 cm.  Normal in echotexture and appearance.  Left testis:  4.1 x 2.3 x 3.9 cm.  Normal in echotexture and appearance.  Right epididymis:  Appears diffusely enlarged measuring 2.7 x 1.6 x 2.9 cm with hypervascular flow on color Doppler imaging.  Left epididymis:  Normal in size measuring 1.2 x 1.0 x 0.5 cm, with normal flow on color Doppler imaging.  Hydocele:  Trace right-sided hydrocele.  No left-sided hydrocele.  Varicocele:  Absent  Pulsed Doppler interrogation of both testes demonstrates low resistance flow bilaterally.  IMPRESSION: 1.  Findings, as above, consistent with right-sided epididymitis with a trace right-sided hydrocele. 2.  The appearance of the testicles is  normal bilaterally.  Original Report Authenticated By: Florencia Reasons, M.D.    ASSESSMENT: This is a very pleasant 21 years old white male with history of idiopathic thrombocytopenic purpura status post treatment with prednisone and Rituxan. He is currently on observation within normal platelets count.  PLAN: I discussed the lab result with the patient and his family. I recommended for him continuous observation with repeat CBC in 3 months. He was advised to call me immediately she has any concerning symptoms in the interval especially any bleeding issues, bruises or ecchymosis  All questions were answered. The patient knows to call the clinic with any problems, questions or concerns. We can certainly see the patient much sooner if necessary.

## 2011-08-03 NOTE — Discharge Instructions (Signed)
Return to the hospital for severe or worsening symptoms - call the Urologist office listed above for further testing if you don;t improve over the next week.  Your ultrasound confirms your diagnosis - see the attached reading instructions.

## 2011-08-03 NOTE — Telephone Encounter (Signed)
gv pt appt schedule for sept °

## 2011-08-24 ENCOUNTER — Telehealth: Payer: Self-pay | Admitting: Medical Oncology

## 2011-08-24 NOTE — Telephone Encounter (Signed)
Mother reports Daryl Mejia called her this am with swelling in groin and pain in stomach. He saw urologist today next to [facility] and was started on antibiotic- she does not know the name of med. ( He was treated for epidydymitis June 6th and completed antibiotic for same.). She was concerned that labs were not done. I told her to have her son call the urologist back for further questions or concerns and call our office if needed.

## 2011-10-12 ENCOUNTER — Telehealth: Payer: Self-pay | Admitting: *Deleted

## 2011-10-12 NOTE — Telephone Encounter (Signed)
Pt's mother called stating that pt has has off and on infections in his testicles.  It has now spread to both of them and she states the urologist has only just been putting him on antibiotics.  Per Dr Donnald Garre, pt does need to f/u with urologist because this is not r/t his ITP.  She stated she will probably get a 2nd opinion at another urologist.  She is concerned that he is getting put in abx and that this will decrease his plt count.  Dr Donnald Garre informed, no further orders at this time.  Melissa will keep [place] informed.  SLJ

## 2011-10-13 ENCOUNTER — Telehealth: Payer: Self-pay | Admitting: *Deleted

## 2011-10-13 NOTE — Telephone Encounter (Signed)
Received call from Alisa at [facility] stating that pt was assessed and has epididymis.  His urologist wants to put him on celebrex and Levaquin and Serina Cowper is calling to make sure Dr Donnald Garre is okay with him taking these medications.  Per Dr Donnald Garre, okay for pt to start celebrex and Levaquin, but to call if any develops any new bruising or bleeding.  Serina Cowper stated she will inform the patient.  SLJ

## 2011-11-02 ENCOUNTER — Telehealth: Payer: Self-pay | Admitting: Oncology

## 2011-11-02 ENCOUNTER — Other Ambulatory Visit (HOSPITAL_BASED_OUTPATIENT_CLINIC_OR_DEPARTMENT_OTHER): Payer: 59

## 2011-11-02 ENCOUNTER — Ambulatory Visit (HOSPITAL_BASED_OUTPATIENT_CLINIC_OR_DEPARTMENT_OTHER): Payer: 59 | Admitting: Internal Medicine

## 2011-11-02 VITALS — BP 119/74 | HR 70 | Temp 96.9°F | Resp 20 | Ht 69.0 in | Wt 183.6 lb

## 2011-11-02 DIAGNOSIS — D693 Immune thrombocytopenic purpura: Secondary | ICD-10-CM

## 2011-11-02 LAB — CBC WITH DIFFERENTIAL/PLATELET
BASO%: 0.3 % (ref 0.0–2.0)
EOS%: 0.5 % (ref 0.0–7.0)
Eosinophils Absolute: 0 10*3/uL (ref 0.0–0.5)
LYMPH%: 19.2 % (ref 14.0–49.0)
MCH: 29.9 pg (ref 27.2–33.4)
MCHC: 34.4 g/dL (ref 32.0–36.0)
MCV: 87.1 fL (ref 79.3–98.0)
MONO%: 6.7 % (ref 0.0–14.0)
Platelets: 126 10*3/uL — ABNORMAL LOW (ref 140–400)
RBC: 5.29 10*6/uL (ref 4.20–5.82)

## 2011-11-02 LAB — COMPREHENSIVE METABOLIC PANEL (CC13)
Alkaline Phosphatase: 65 U/L (ref 40–150)
Glucose: 94 mg/dl (ref 70–99)
Sodium: 139 mEq/L (ref 136–145)
Total Bilirubin: 1.1 mg/dL (ref 0.20–1.20)
Total Protein: 7.2 g/dL (ref 6.4–8.3)

## 2011-11-02 NOTE — Progress Notes (Signed)
[facility] Telephone:(336) [phone]   Fax:(336) [phone]  OFFICE PROGRESS NOTE  [name], MD [address] 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  2) status post 4 weekly doses of Rituxan 375 mg/M2, last dose was given on 06/02/2011.   CURRENT THERAPY: None  INTERVAL HISTORY: Daryl Mejia 21 y.o. male returns to the clinic today for routine three-month followup visit accompanied by his mother. The patient is feeling fine today with no specific complaints. He is currently under treatment for epididymitis by a urologist in [facility]. He denied having any significant weight loss or night sweats, no chest pain, shortness breath, cough or hemoptysis. He has no bleeding issues. He has repeat CBC performed earlier today and he is here for evaluation and discussion of his lab results.  MEDICAL HISTORY: Past Medical History  Diagnosis Date  . Pneumonia     2007  . Migraine     hx of migraines in middle school    ALLERGIES:  is allergic to promethazine hcl.  MEDICATIONS:  Current Outpatient Prescriptions  Medication Sig Dispense Refill  . naproxen (NAPROSYN) 500 MG tablet Take 1 tablet (500 mg total) by mouth 2 (two) times daily with a meal.  30 tablet  0    SURGICAL HISTORY:  Past Surgical History  Procedure Date  . Knee surgery     2004/2005  . Back surgery     REVIEW OF SYSTEMS:  A comprehensive review of systems was negative.   PHYSICAL EXAMINATION: General appearance: alert, cooperative and no distress Neck: no adenopathy Lymph nodes: Cervical, supraclavicular, and axillary nodes normal. Resp: clear to auscultation bilaterally Cardio: regular rate and rhythm, S1, S2 normal, no murmur, click, rub or gallop GI: soft, non-tender; bowel sounds normal; no masses,  no organomegaly Extremities: extremities normal, atraumatic, no cyanosis or  edema Neurologic: Alert and oriented X 3, normal strength and tone. Normal symmetric reflexes. Normal coordination and gait  ECOG PERFORMANCE STATUS: 0 - Asymptomatic  Blood pressure 119/74, pulse 70, temperature 96.9 F (36.1 C), temperature source Oral, resp. rate 20, height 5\' 9"  (1.753 m), weight 183 lb 9.6 oz (83.28 kg).  LABORATORY DATA: Lab Results  Component Value Date   WBC 9.9 11/02/2011   HGB 15.8 11/02/2011   HCT 46.1 11/02/2011   MCV 87.1 11/02/2011   PLT 126* 11/02/2011      Chemistry      Component Value Date/Time   NA 139 11/02/2011 1515   NA 137 05/10/2011 1144   K 4.5 11/02/2011 1515   K 4.5 05/10/2011 1144   CL 103 11/02/2011 1515   CL 102 05/10/2011 1144   CO2 25 11/02/2011 1515   CO2 23 05/10/2011 1144   BUN 17.0 11/02/2011 1515   BUN 23 05/10/2011 1144   CREATININE 1.2 11/02/2011 1515   CREATININE 1.08 05/10/2011 1144      Component Value Date/Time   CALCIUM 9.6 11/02/2011 1515   CALCIUM 10.0 05/10/2011 1144   ALKPHOS 65 11/02/2011 1515   ALKPHOS 57 05/10/2011 1144   AST 29 11/02/2011 1515   AST 27 05/10/2011 1144   ALT 23 11/02/2011 1515   ALT 26 05/10/2011 1144   BILITOT 1.10 11/02/2011 1515   BILITOT 0.5 05/10/2011 1144       RADIOGRAPHIC STUDIES: No results found.  ASSESSMENT: This is a very pleasant 21 years old white male  with immune thrombocytopenic purpura refractory to steroids status post 4 weekly doses of Rituxan completed in April 2013 and the patient has been observation since that time with a stable platelet count over 100,000.  PLAN: I discussed the lab result with the patient and his mother and recommended for him to continue on observation for now. I would see him back for followup visit in 3 months with repeat CBC, comprehensive metabolic panel and LDH. He was advised to call me immediately if he has any concerning symptoms in the interval especially any bleeding issues bruises or ecchymosis.  All questions were answered. The patient knows to call the clinic with  any problems, questions or concerns. We can certainly see the patient much sooner if necessary.

## 2011-11-02 NOTE — Patient Instructions (Signed)
Your platelets count are stable. He would continue on observation for now with repeat blood work in 3 months.

## 2011-11-02 NOTE — Telephone Encounter (Signed)
Gave patient appt calendar for December 2013 lab and MD

## 2011-11-22 ENCOUNTER — Telehealth: Payer: Self-pay | Admitting: Medical Oncology

## 2011-11-22 NOTE — Telephone Encounter (Signed)
Pt was started on new meds by Dr Edwin Cap. I called back and requested that Alysia fax meds to [place].

## 2011-12-23 ENCOUNTER — Telehealth: Payer: Self-pay | Admitting: *Deleted

## 2011-12-23 ENCOUNTER — Other Ambulatory Visit: Payer: Self-pay | Admitting: *Deleted

## 2011-12-23 ENCOUNTER — Telehealth: Payer: Self-pay | Admitting: Internal Medicine

## 2011-12-23 NOTE — Telephone Encounter (Signed)
pt was scheduled with the wrong doctor,called and lm with new appt for dr mkm    aom

## 2011-12-23 NOTE — Telephone Encounter (Signed)
[facility] progress note given to Dr Donnald Garre to review.

## 2011-12-23 NOTE — Telephone Encounter (Signed)
Pt's mother called concerned b/c epididymitis is still not resolved and he is being put on Levaquin again for 1 month.  She has a call in with the urologist to clarify whether levaquin is appropriate since he was on it recently.    Per progress note from Dr Edwin Cap, Dr Edwin Cap would like for Dr Donnald Garre to monitor plts while on antibiotics.  Msg given to Dr Donnald Garre to determine how Dr Donnald Garre would like to proceed.  Progress note is also on Dr [facility] desk to review as well.  SLJ

## 2012-01-04 ENCOUNTER — Telehealth: Payer: Self-pay | Admitting: *Deleted

## 2012-01-04 DIAGNOSIS — D693 Immune thrombocytopenic purpura: Secondary | ICD-10-CM

## 2012-01-04 NOTE — Telephone Encounter (Signed)
Per Dr Donnald Garre, okay to check CBC this week to assess plt count while on levaquin.  Onc tx schedule sent to schedulers.  SLJ

## 2012-01-05 ENCOUNTER — Telehealth: Payer: Self-pay | Admitting: Internal Medicine

## 2012-01-05 NOTE — Telephone Encounter (Signed)
S/w the pt's mother regarding the lab appt for Friday@4 :00pm.

## 2012-01-06 ENCOUNTER — Other Ambulatory Visit (HOSPITAL_BASED_OUTPATIENT_CLINIC_OR_DEPARTMENT_OTHER): Payer: 59 | Admitting: Lab

## 2012-01-06 DIAGNOSIS — D693 Immune thrombocytopenic purpura: Secondary | ICD-10-CM

## 2012-01-06 LAB — CBC WITH DIFFERENTIAL/PLATELET
BASO%: 0.3 % (ref 0.0–2.0)
Eosinophils Absolute: 0.1 10*3/uL (ref 0.0–0.5)
HCT: 40.8 % (ref 38.4–49.9)
LYMPH%: 26.9 % (ref 14.0–49.0)
MCHC: 35.1 g/dL (ref 32.0–36.0)
MONO#: 0.6 10*3/uL (ref 0.1–0.9)
NEUT%: 64.3 % (ref 39.0–75.0)
Platelets: 109 10*3/uL — ABNORMAL LOW (ref 140–400)
WBC: 8.6 10*3/uL (ref 4.0–10.3)

## 2012-01-09 ENCOUNTER — Telehealth: Payer: Self-pay | Admitting: *Deleted

## 2012-01-09 NOTE — Telephone Encounter (Signed)
Per Dr Donnald Garre, no changes need to be made with plts 109,000.  Pt can continue on levaquin as prescribed by urology MD and will proceed with schedule as scheduled.  Pt's mother verbalized understanding.  SLJ

## 2012-01-31 ENCOUNTER — Ambulatory Visit: Payer: 59 | Admitting: Oncology

## 2012-01-31 ENCOUNTER — Other Ambulatory Visit: Payer: 59 | Admitting: Lab

## 2012-02-01 ENCOUNTER — Ambulatory Visit (HOSPITAL_BASED_OUTPATIENT_CLINIC_OR_DEPARTMENT_OTHER): Payer: 59 | Admitting: Internal Medicine

## 2012-02-01 ENCOUNTER — Telehealth: Payer: Self-pay | Admitting: Internal Medicine

## 2012-02-01 ENCOUNTER — Other Ambulatory Visit (HOSPITAL_BASED_OUTPATIENT_CLINIC_OR_DEPARTMENT_OTHER): Payer: 59

## 2012-02-01 VITALS — BP 127/63 | HR 81 | Temp 97.9°F | Resp 20 | Ht 69.0 in | Wt 186.7 lb

## 2012-02-01 DIAGNOSIS — D693 Immune thrombocytopenic purpura: Secondary | ICD-10-CM

## 2012-02-01 LAB — CBC WITH DIFFERENTIAL/PLATELET
BASO%: 0.5 % (ref 0.0–2.0)
EOS%: 2.3 % (ref 0.0–7.0)
HCT: 41.1 % (ref 38.4–49.9)
LYMPH%: 28.5 % (ref 14.0–49.0)
MCH: 30.4 pg (ref 27.2–33.4)
MCHC: 34.7 g/dL (ref 32.0–36.0)
MCV: 87.5 fL (ref 79.3–98.0)
MONO%: 10 % (ref 0.0–14.0)
NEUT%: 58.7 % (ref 39.0–75.0)
Platelets: 124 10*3/uL — ABNORMAL LOW (ref 140–400)

## 2012-02-01 LAB — COMPREHENSIVE METABOLIC PANEL (CC13)
ALT: 27 U/L (ref 0–55)
AST: 30 U/L (ref 5–34)
Calcium: 8.9 mg/dL (ref 8.4–10.4)
Creatinine: 1.1 mg/dL (ref 0.7–1.3)
Total Bilirubin: 0.36 mg/dL (ref 0.20–1.20)

## 2012-02-01 NOTE — Progress Notes (Signed)
[facility] Telephone:(336) [phone]   Fax:(336) [phone]  OFFICE PROGRESS NOTE  [name], MD 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  2) status post 4 weekly doses of Rituxan 375 mg/M2, last dose was given on 06/02/2011.   CURRENT THERAPY: None  INTERVAL HISTORY: Daryl Mejia 21 y.o. male returns to the clinic today for followup visit accompanied his mother. The patient is feeling fine today with no specific complaints he has been on treatment with antibiotic for epididymitis since June of 2013. The patient denied having any bruising issues. He denied having any chest pain, shortness breath, cough or hemoptysis. He has no weight loss or night sweats. He has no fever or chills. He has repeat CBC performed earlier today and he is here for evaluation and discussion of his lab results.  MEDICAL HISTORY: Past Medical History  Diagnosis Date  . Pneumonia     2007  . Migraine     hx of migraines in middle school    ALLERGIES:  is allergic to promethazine hcl.  MEDICATIONS:  Current Outpatient Prescriptions  Medication Sig Dispense Refill  . levofloxacin (LEVAQUIN) 500 MG tablet Take 500 mg by mouth Daily.      . naproxen (NAPROSYN) 500 MG tablet Take 1 tablet (500 mg total) by mouth 2 (two) times daily with a meal.  30 tablet  0    SURGICAL HISTORY:  Past Surgical History  Procedure Date  . Knee surgery     2004/2005  . Back surgery     REVIEW OF SYSTEMS:  A comprehensive review of systems was negative.   PHYSICAL EXAMINATION: General appearance: alert, cooperative and no distress Head: Normocephalic, without obvious abnormality, atraumatic Neck: no adenopathy Lymph nodes: Cervical, supraclavicular, and axillary nodes normal. Resp: clear to auscultation bilaterally Cardio: regular rate and rhythm, S1, S2 normal, no murmur, click,  rub or gallop GI: soft, non-tender; bowel sounds normal; no masses,  no organomegaly Extremities: extremities normal, atraumatic, no cyanosis or edema  ECOG PERFORMANCE STATUS: 0 - Asymptomatic  There were no vitals taken for this visit.  LABORATORY DATA: Lab Results  Component Value Date   WBC 6.1 02/01/2012   HGB 14.3 02/01/2012   HCT 41.1 02/01/2012   MCV 87.5 02/01/2012   PLT 124* 02/01/2012      Chemistry      Component Value Date/Time   NA 139 11/02/2011 1515   NA 137 05/10/2011 1144   K 4.5 11/02/2011 1515   K 4.5 05/10/2011 1144   CL 103 11/02/2011 1515   CL 102 05/10/2011 1144   CO2 25 11/02/2011 1515   CO2 23 05/10/2011 1144   BUN 17.0 11/02/2011 1515   BUN 23 05/10/2011 1144   CREATININE 1.2 11/02/2011 1515   CREATININE 1.08 05/10/2011 1144      Component Value Date/Time   CALCIUM 9.6 11/02/2011 1515   CALCIUM 10.0 05/10/2011 1144   ALKPHOS 65 11/02/2011 1515   ALKPHOS 57 05/10/2011 1144   AST 29 11/02/2011 1515   AST 27 05/10/2011 1144   ALT 23 11/02/2011 1515   ALT 26 05/10/2011 1144   BILITOT 1.10 11/02/2011 1515   BILITOT 0.5 05/10/2011 1144       RADIOGRAPHIC STUDIES: No results found.  ASSESSMENT: This is a very pleasant 21 years old white male with history of idiopathic thrombocytopenic purpura status post  treatment with Rituxan and currently on observation. The patient is doing fine and his platelets count are stable.  PLAN: I recommended for him to continue on observation for now with repeat CBC in 3 months. The patient was advised to call immediately she has any concerning symptoms in the interval especially any bleeding, bruises or ecchymosis.  All questions were answered. The patient knows to call the clinic with any problems, questions or concerns. We can certainly see the patient much sooner if necessary.

## 2012-02-01 NOTE — Patient Instructions (Addendum)
Your platelets count are stable today. Followup in 3 months with repeat CBC

## 2012-02-01 NOTE — Telephone Encounter (Signed)
appts made and printed for pt aom °

## 2012-05-01 ENCOUNTER — Other Ambulatory Visit: Payer: 59 | Admitting: Lab

## 2012-05-01 ENCOUNTER — Ambulatory Visit: Payer: 59 | Admitting: Internal Medicine

## 2012-05-02 ENCOUNTER — Encounter: Payer: Self-pay | Admitting: Internal Medicine

## 2012-05-02 ENCOUNTER — Other Ambulatory Visit (HOSPITAL_BASED_OUTPATIENT_CLINIC_OR_DEPARTMENT_OTHER): Payer: 59

## 2012-05-02 ENCOUNTER — Ambulatory Visit (HOSPITAL_BASED_OUTPATIENT_CLINIC_OR_DEPARTMENT_OTHER): Payer: 59 | Admitting: Internal Medicine

## 2012-05-02 VITALS — BP 97/61 | HR 62 | Temp 97.0°F | Resp 18 | Ht 69.0 in | Wt 186.2 lb

## 2012-05-02 DIAGNOSIS — D693 Immune thrombocytopenic purpura: Secondary | ICD-10-CM

## 2012-05-02 LAB — CBC WITH DIFFERENTIAL/PLATELET
BASO%: 0.6 % (ref 0.0–2.0)
LYMPH%: 28 % (ref 14.0–49.0)
MCHC: 34.6 g/dL (ref 32.0–36.0)
MONO#: 0.6 10*3/uL (ref 0.1–0.9)
MONO%: 8.4 % (ref 0.0–14.0)
NEUT#: 4.3 10*3/uL (ref 1.5–6.5)
Platelets: 124 10*3/uL — ABNORMAL LOW (ref 140–400)
RBC: 4.89 10*6/uL (ref 4.20–5.82)
RDW: 12.8 % (ref 11.0–14.6)
WBC: 7 10*3/uL (ref 4.0–10.3)

## 2012-05-02 LAB — COMPREHENSIVE METABOLIC PANEL (CC13)
ALT: 20 U/L (ref 0–55)
Albumin: 3.9 g/dL (ref 3.5–5.0)
Alkaline Phosphatase: 62 U/L (ref 40–150)
CO2: 28 mEq/L (ref 22–29)
Potassium: 4.4 mEq/L (ref 3.5–5.1)
Sodium: 142 mEq/L (ref 136–145)
Total Bilirubin: 0.55 mg/dL (ref 0.20–1.20)
Total Protein: 6.7 g/dL (ref 6.4–8.3)

## 2012-05-02 NOTE — Patient Instructions (Addendum)
Platelets count are stable. Followup in 6 months with repeat CBC, comprehensive metabolic panel and LDH. Please call if you have any concerning symptoms in the interval.

## 2012-05-02 NOTE — Progress Notes (Signed)
[facility] Telephone:([phone]   Fax:([phone]  OFFICE PROGRESS NOTE  [name], MD [address] 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  2) status post 4 weekly doses of Rituxan 375 mg/M2, last dose was given on 06/02/2011.   CURRENT THERAPY: None  INTERVAL HISTORY: Daryl Mejia 22 y.o. male returns to the clinic today for  followup visit accompanied his girlfriend. The patient is feeling fine today with no specific complaints. He denied having any significant bleeding, bruises or ecchymosis. He denied having any significant weight loss or night sweats. He has no chest pain, shortness breath, cough or hemoptysis. The patient has repeat CBC performed earlier today and he is here for evaluation and discussion of his lab results.  MEDICAL HISTORY: Past Medical History  Diagnosis Date  . Pneumonia     2007  . Migraine     hx of migraines in middle school    ALLERGIES:  is allergic to promethazine hcl.  MEDICATIONS:  No current outpatient prescriptions on file.   No current facility-administered medications for this visit.    SURGICAL HISTORY:  Past Surgical History  Procedure Laterality Date  . Knee surgery      2004/2005  . Back surgery      REVIEW OF SYSTEMS:  A comprehensive review of systems was negative.   PHYSICAL EXAMINATION: General appearance: alert, cooperative and no distress Head: Normocephalic, without obvious abnormality, atraumatic Neck: no adenopathy Lymph nodes: Cervical, supraclavicular, and axillary nodes normal. Resp: clear to auscultation bilaterally Cardio: regular rate and rhythm, S1, S2 normal, no murmur, click, rub or gallop GI: soft, non-tender; bowel sounds normal; no masses,  no organomegaly Extremities: extremities normal, atraumatic, no cyanosis or edema  ECOG PERFORMANCE STATUS: 0 -  Asymptomatic  Blood pressure 97/61, pulse 62, temperature 97 F (36.1 C), temperature source Oral, resp. rate 18, height 5\' 9"  (1.753 m), weight 186 lb 3.2 oz (84.46 kg).  LABORATORY DATA: Lab Results  Component Value Date   WBC 7.0 05/02/2012   HGB 14.6 05/02/2012   HCT 42.1 05/02/2012   MCV 86.1 05/02/2012   PLT 124* 05/02/2012      Chemistry      Component Value Date/Time   NA 142 05/02/2012 1516   NA 137 05/10/2011 1144   K 4.4 05/02/2012 1516   K 4.5 05/10/2011 1144   CL 106 05/02/2012 1516   CL 102 05/10/2011 1144   CO2 28 05/02/2012 1516   CO2 23 05/10/2011 1144   BUN 21.3 05/02/2012 1516   BUN 23 05/10/2011 1144   CREATININE 1.3 05/02/2012 1516   CREATININE 1.08 05/10/2011 1144      Component Value Date/Time   CALCIUM 9.0 05/02/2012 1516   CALCIUM 10.0 05/10/2011 1144   ALKPHOS 62 05/02/2012 1516   ALKPHOS 57 05/10/2011 1144   AST 20 05/02/2012 1516   AST 27 05/10/2011 1144   ALT 20 05/02/2012 1516   ALT 26 05/10/2011 1144   BILITOT 0.55 05/02/2012 1516   BILITOT 0.5 05/10/2011 1144       RADIOGRAPHIC STUDIES: No results found.  ASSESSMENT: This is a very pleasant 22 years old white male with history of immune thrombocytopenic purpura refractory to steroids status post treatment with Rituxan with significant improvement in her platelets count that has been stable for the last several months. The patient is currently asymptomatic.  PLAN: I discussed the lab result with the patient today. I recommended for him to continue on observation with repeat CBC, comprehensive metabolic panel and LDH in 6 months.  The patient was advised to call me immediately if he has any concerning symptoms in the interval.  All questions were answered. The patient knows to call the clinic with any problems, questions or concerns. We can certainly see the patient much sooner if necessary.

## 2012-10-31 ENCOUNTER — Encounter: Payer: Self-pay | Admitting: Internal Medicine

## 2012-10-31 ENCOUNTER — Other Ambulatory Visit (HOSPITAL_BASED_OUTPATIENT_CLINIC_OR_DEPARTMENT_OTHER): Payer: 59 | Admitting: Lab

## 2012-10-31 ENCOUNTER — Ambulatory Visit (HOSPITAL_BASED_OUTPATIENT_CLINIC_OR_DEPARTMENT_OTHER): Payer: 59 | Admitting: Internal Medicine

## 2012-10-31 VITALS — BP 116/59 | HR 60 | Temp 98.3°F | Resp 18 | Ht 69.0 in | Wt 179.0 lb

## 2012-10-31 DIAGNOSIS — D693 Immune thrombocytopenic purpura: Secondary | ICD-10-CM

## 2012-10-31 LAB — COMPREHENSIVE METABOLIC PANEL (CC13)
BUN: 16.9 mg/dL (ref 7.0–26.0)
CO2: 23 mEq/L (ref 22–29)
Creatinine: 1.2 mg/dL (ref 0.7–1.3)
Glucose: 90 mg/dl (ref 70–140)
Total Bilirubin: 0.89 mg/dL (ref 0.20–1.20)
Total Protein: 6.9 g/dL (ref 6.4–8.3)

## 2012-10-31 LAB — CBC WITH DIFFERENTIAL/PLATELET
BASO%: 0.4 % (ref 0.0–2.0)
EOS%: 0.9 % (ref 0.0–7.0)
LYMPH%: 26 % (ref 14.0–49.0)
MCHC: 34.5 g/dL (ref 32.0–36.0)
MCV: 89.2 fL (ref 79.3–98.0)
MONO%: 9.6 % (ref 0.0–14.0)
Platelets: 167 10*3/uL (ref 140–400)
RBC: 5.29 10*6/uL (ref 4.20–5.82)

## 2012-10-31 NOTE — Progress Notes (Signed)
[facility] [phone]   [phone]  OFFICE PROGRESS NOTE  [name], MD [address] 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  2) status post 4 weekly doses of Rituxan 375 mg/M2, last dose was given on 06/02/2011.   CURRENT THERAPY: Observation.   INTERVAL HISTORY: Daryl Mejia 22 y.o. male returns to the clinic today for six-month followup visit accompanied by his mother. The patient is feeling fine today with no specific complaints. He denied having any significant weight loss or night sweats. He denied having any chest pain, shortness breath, cough or hemoptysis. The patient denied having any bleeding issues, bruises or ecchymosis. He has repeat CBC performed earlier today and is here for evaluation and discussion of his lab results.  MEDICAL HISTORY: Past Medical History  Diagnosis Date  . Pneumonia     2007  . Migraine     hx of migraines in middle school    ALLERGIES:  is allergic to promethazine hcl.  MEDICATIONS:  No current outpatient prescriptions on file.   No current facility-administered medications for this visit.    SURGICAL HISTORY:  Past Surgical History  Procedure Laterality Date  . Knee surgery      2004/2005  . Back surgery      REVIEW OF SYSTEMS:  A comprehensive review of systems was negative.   PHYSICAL EXAMINATION: General appearance: alert, cooperative and no distress Head: Normocephalic, without obvious abnormality, atraumatic Neck: no adenopathy Lymph nodes: Cervical, supraclavicular, and axillary nodes normal. Resp: clear to auscultation bilaterally Cardio: regular rate and rhythm, S1, S2 normal, no murmur, click, rub or gallop GI: soft, non-tender; bowel sounds normal; no masses,  no organomegaly Extremities: extremities normal, atraumatic, no cyanosis or edema  ECOG PERFORMANCE  STATUS: 0 - Asymptomatic  Blood pressure 116/59, pulse 60, temperature 98.3 F (36.8 C), temperature source Oral, resp. rate 18, height 5\' 9"  (1.753 m), weight 179 lb (81.194 kg), SpO2 100.00%.  LABORATORY DATA: Lab Results  Component Value Date   WBC 8.4 10/31/2012   HGB 16.3 10/31/2012   HCT 47.1 10/31/2012   MCV 89.2 10/31/2012   PLT 167 10/31/2012      Chemistry      Component Value Date/Time   NA 139 10/31/2012 1516   NA 137 05/10/2011 1144   K 4.1 10/31/2012 1516   K 4.5 05/10/2011 1144   CL 106 05/02/2012 1516   CL 102 05/10/2011 1144   CO2 23 10/31/2012 1516   CO2 23 05/10/2011 1144   BUN 16.9 10/31/2012 1516   BUN 23 05/10/2011 1144   CREATININE 1.2 10/31/2012 1516   CREATININE 1.08 05/10/2011 1144      Component Value Date/Time   CALCIUM 8.9 10/31/2012 1516   CALCIUM 10.0 05/10/2011 1144   ALKPHOS 56 10/31/2012 1516   ALKPHOS 57 05/10/2011 1144   AST 28 10/31/2012 1516   AST 27 05/10/2011 1144   ALT 28 10/31/2012 1516   ALT 26 05/10/2011 1144   BILITOT 0.89 10/31/2012 1516   BILITOT 0.5 05/10/2011 1144       RADIOGRAPHIC STUDIES: No results found.  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 He has normal platelets count today. I discussed the lab result with the patient and his mother. I gave him the option of continuous observation and repeat CBC every 6 months versus followup visit on as-needed basis especially the patient has any significant bleeding, bruises or ecchymosis or any significant abnormalities and blood count performed by his primary care physician. The patient preferred provider on as-needed basis at this point. He was advised to call immediately if he has any concerning symptoms. The patient voices understanding of current disease status and treatment  options and is in agreement with the current care plan.  All questions were answered. The patient knows to call the clinic with any problems, questions or concerns. We can certainly see the patient much sooner if necessary.

## 2013-04-24 ENCOUNTER — Telehealth: Payer: Self-pay | Admitting: *Deleted

## 2013-04-24 NOTE — Telephone Encounter (Signed)
Pt's mom called left a msg wanting to know what pt can take for a cold and sore throat.  Per Dr Donnald GarreMKM, pt can take anything OTC for colds EXCEPT for ibuprofen, alka-seltzer, or ASA.  Left msg informing pt's mom of instructions.  SLJ

## 2013-10-17 IMAGING — US US ABDOMEN COMPLETE
1 series · 14 of 25 positions shown · non-contrast
Comparison: None.

CLINICAL DATA: Thrombocytopenia.  Rule out liver/splenic
enlargement.

COMPLETE ABDOMINAL ULTRASOUND

[Series 1: us abdomen complete · 0.32mm/px · 14 of 75 slices shown]
[im 1/75]
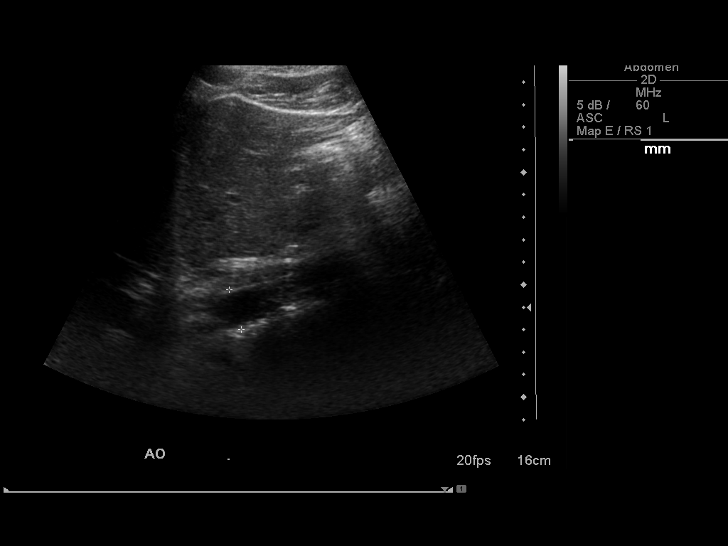
[im 7/75]
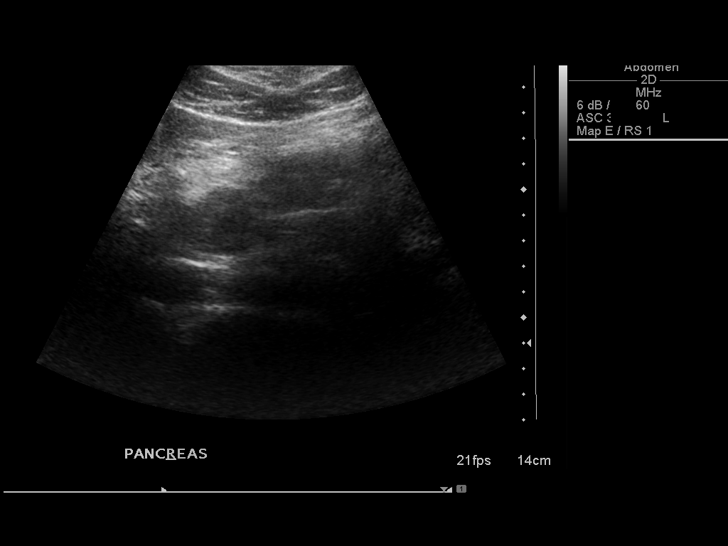
[im 13/75]
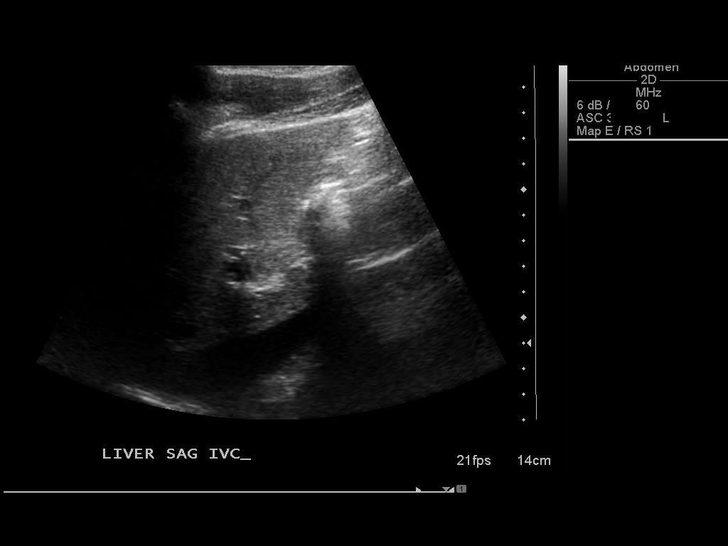
[im 19/75]
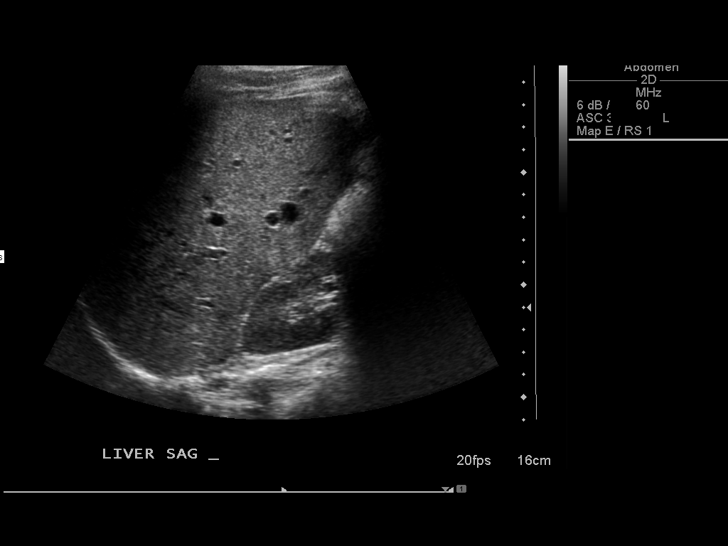
[im 25/75]
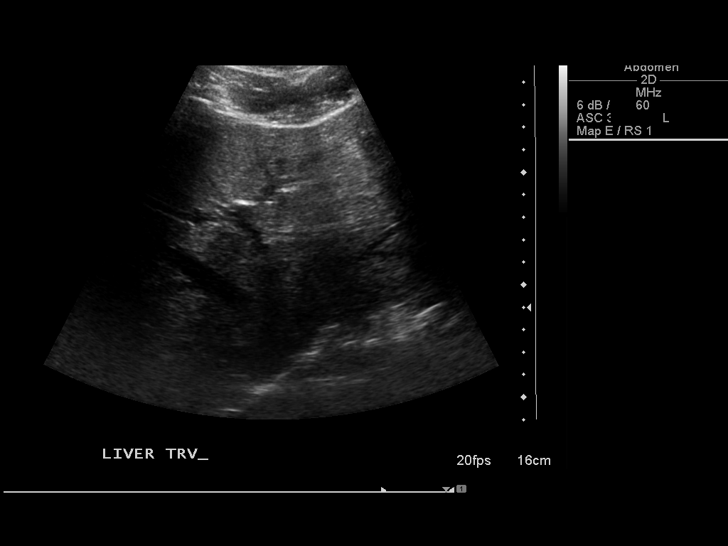
[im 28/75]
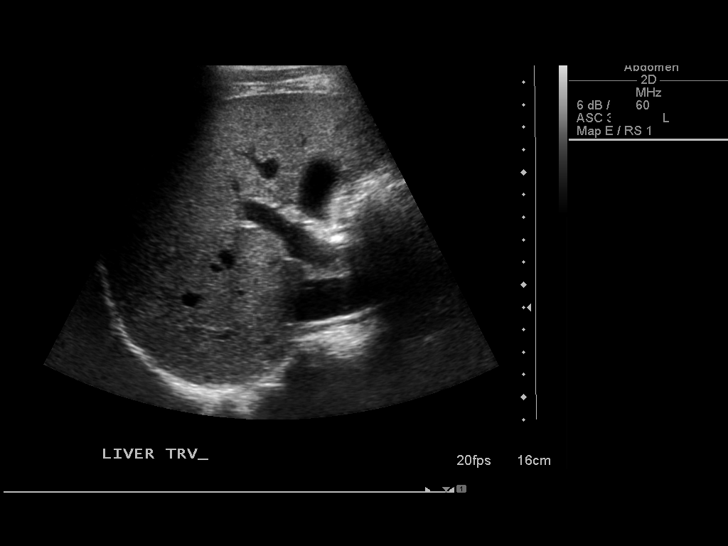
[im 34/75]
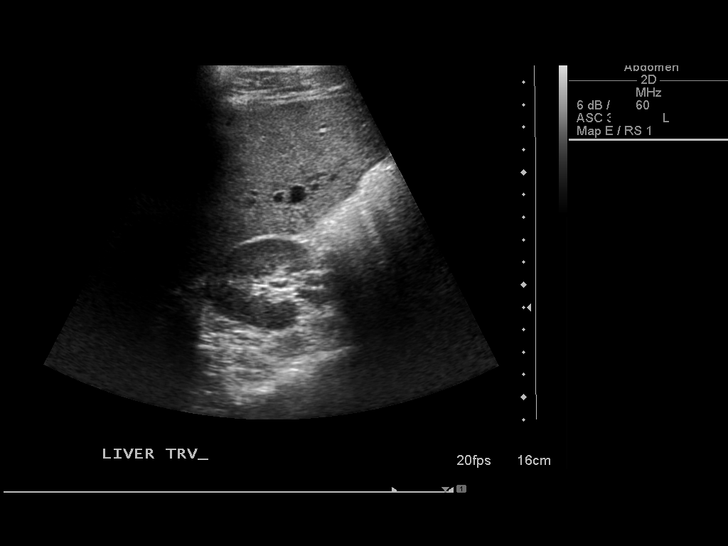
[im 41/75]
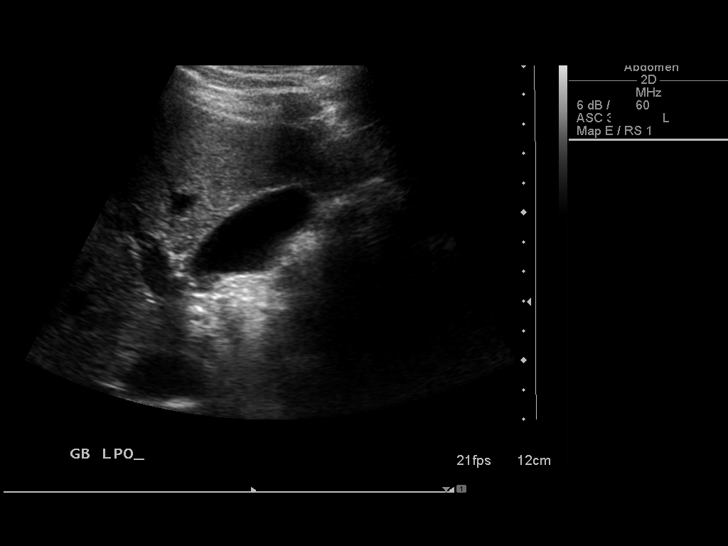
[im 47/75]
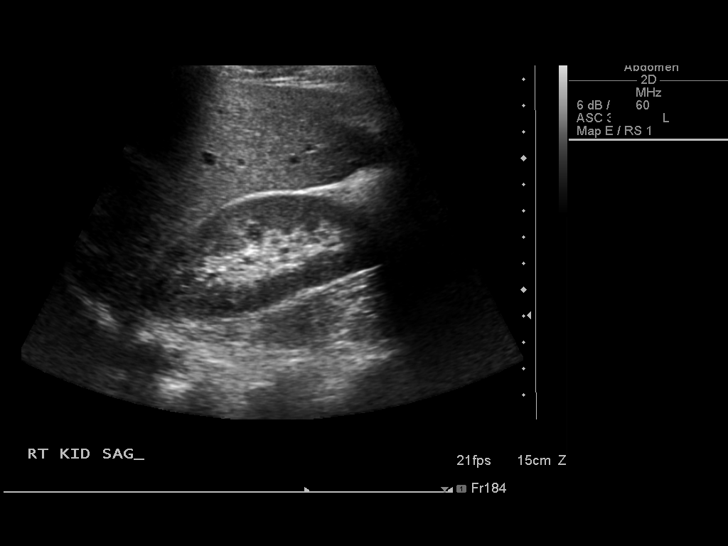
[im 50/75]
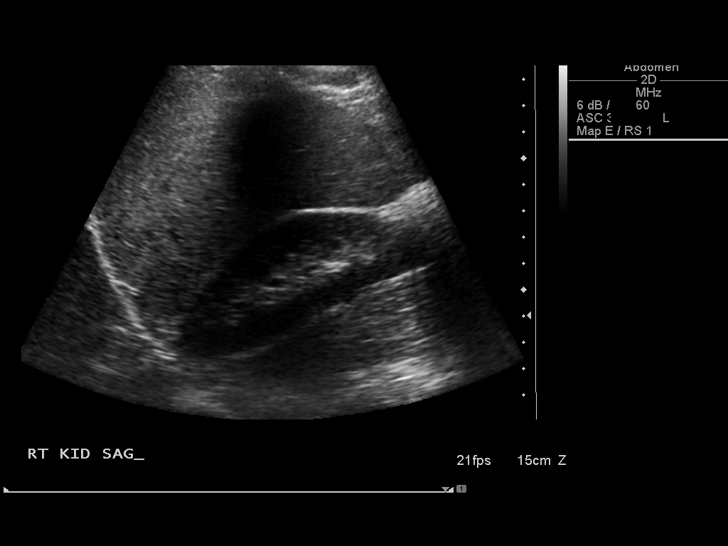
[im 56/75]
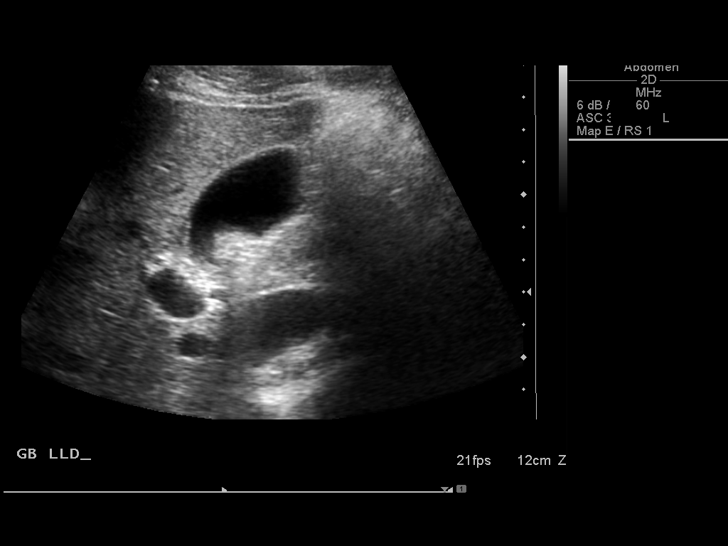
[im 62/75]
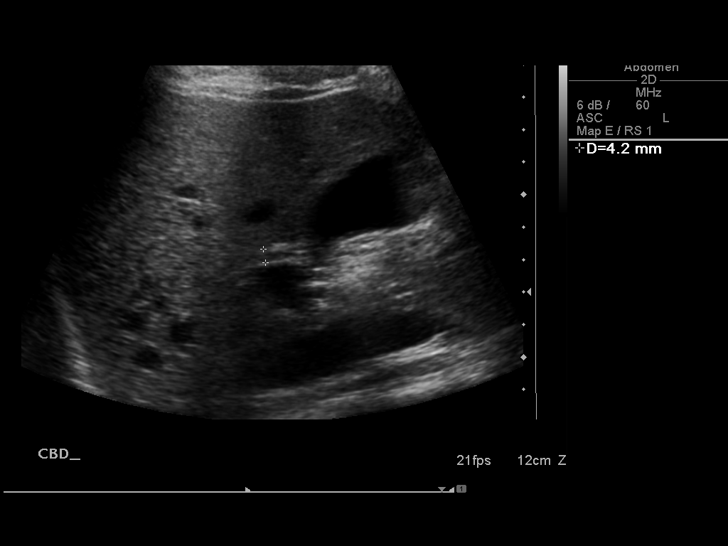
[im 68/75]
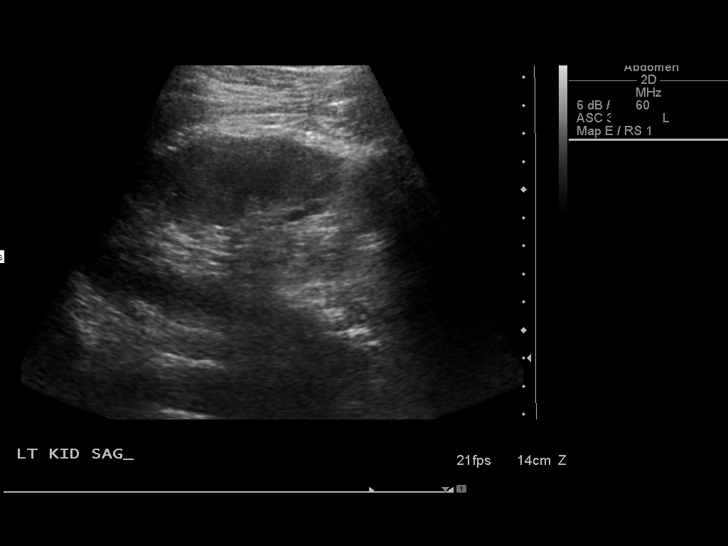
[im 75/75]
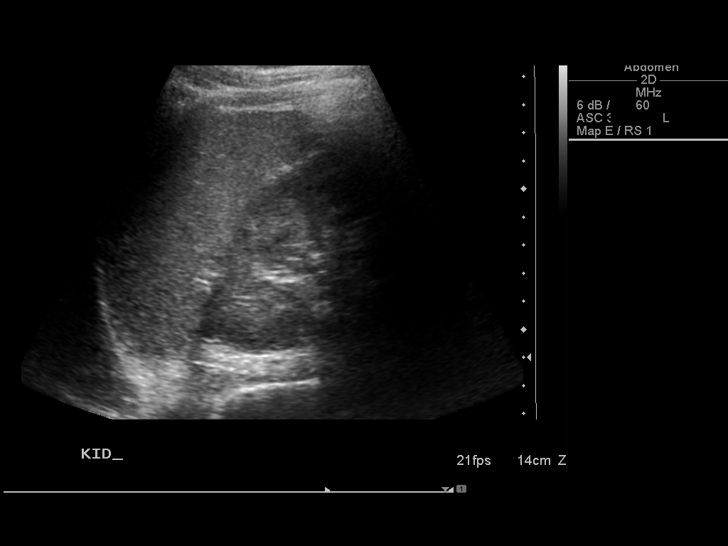

[14 of 25 positions shown; findings below may reference images not displayed]

FINDINGS: Gallbladder:  No gallstones, gallbladder wall thickening, or
pericholecystic fluid.

Common bile duct:  4.2 mm.

Liver:  Ultrasound technologist did not obtain a liver measurement.
On the images submitted, the liver spans over approximately
cm.  No focal hepatic lesion.

IVC:  Appears normal.

Pancreas:  Obscured by bowel gas.

Spleen:  9.5 x 12.3 x 5.8 cm consistent with volume of 354 ml.  No
focal mass.

Right Kidney:  10.6 cm. No hydronephrosis or renal mass..

Left Kidney:  11.0 cm. No hydronephrosis or renal mass..

Abdominal aorta:  No aneurysm identified.
IMPRESSION: Spleen and liver although top normal in size do not
appear enlarged as noted above.

## 2014-01-28 IMAGING — US US SCROTUM
1 series · 14 of 25 positions shown · non-contrast
Comparison: No priors.

CLINICAL DATA: Groin swelling and pain.

SCROTAL ULTRASOUND
DOPPLER ULTRASOUND OF THE TESTICLES
TECHNIQUE: Complete ultrasound examination of the testicles,
epididymis, and other scrotal structures was performed.  Color and
spectral Doppler ultrasound were also utilized to evaluate blood
flow to the testicles.

[Series 1: us scrotum · 0.08mm/px · 14 of 52 slices shown]
[im 1/52]
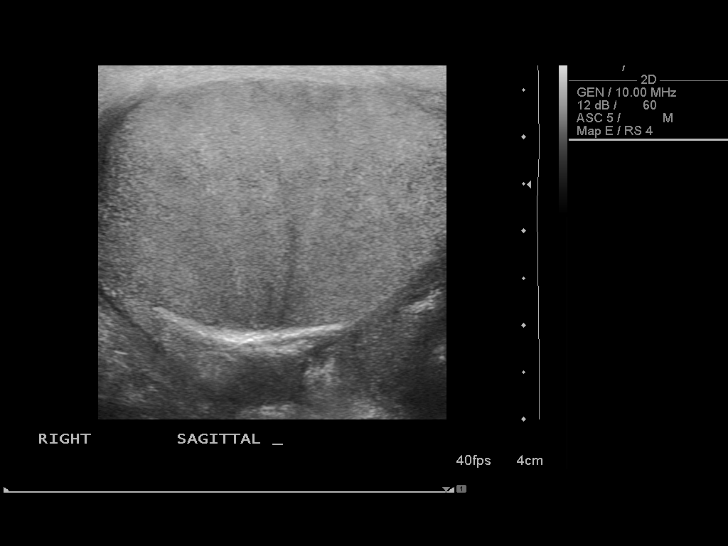
[im 5/52]
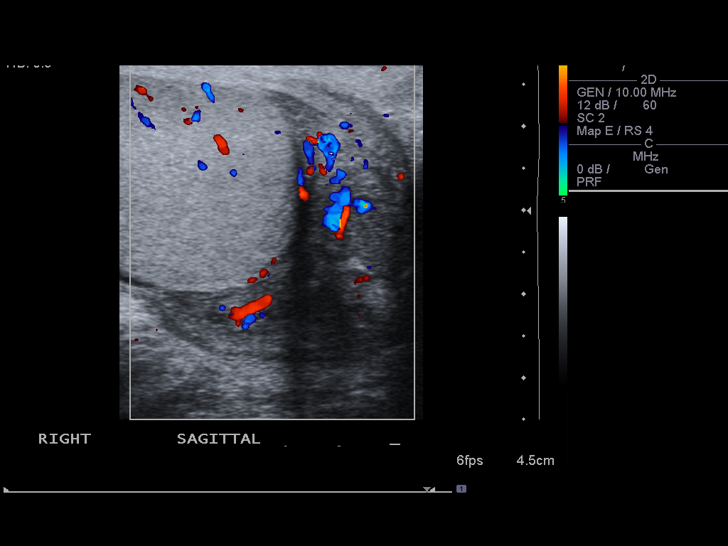
[im 9/52]
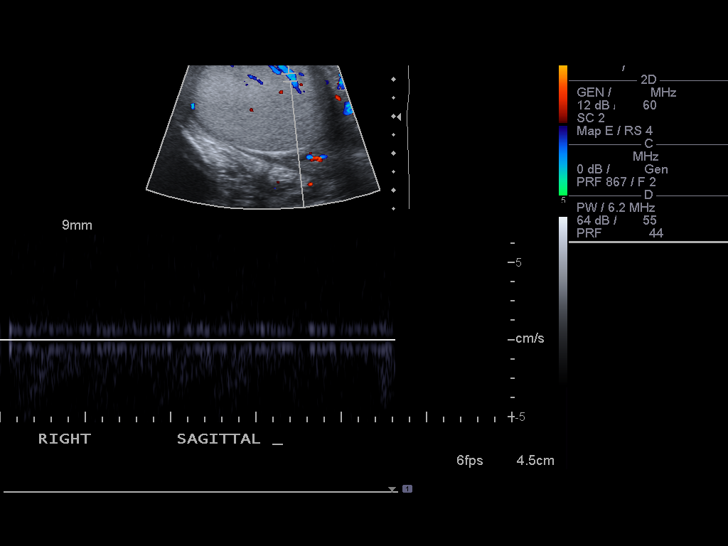
[im 13/52]
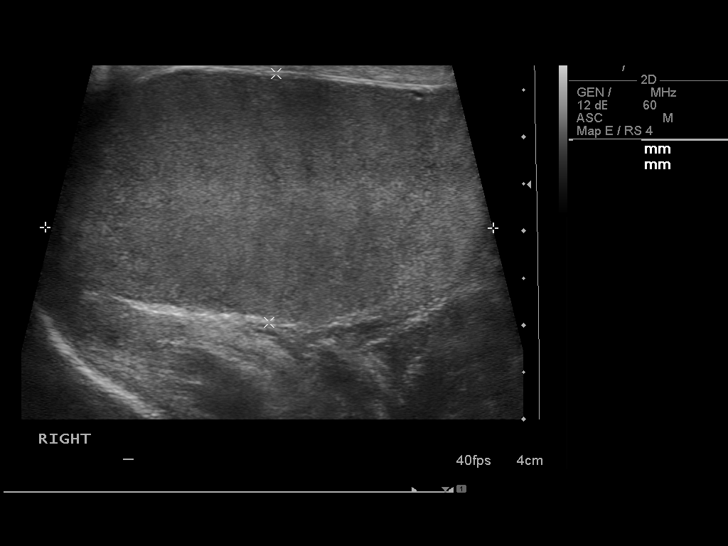
[im 18/52]
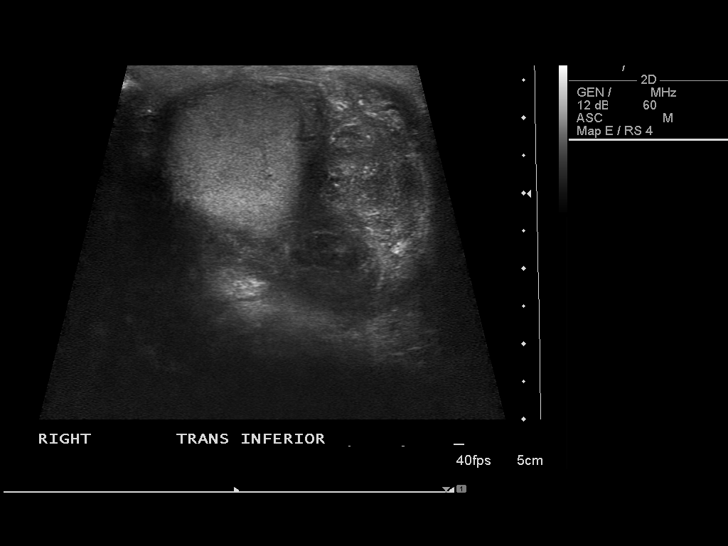
[im 20/52]
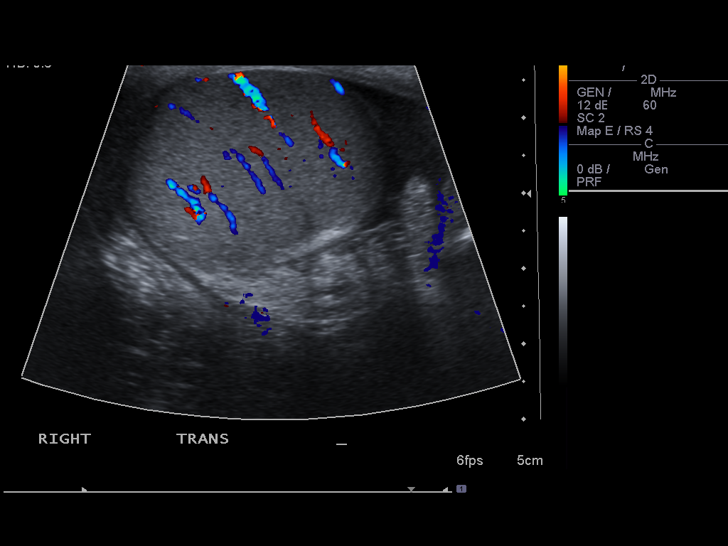
[im 24/52]
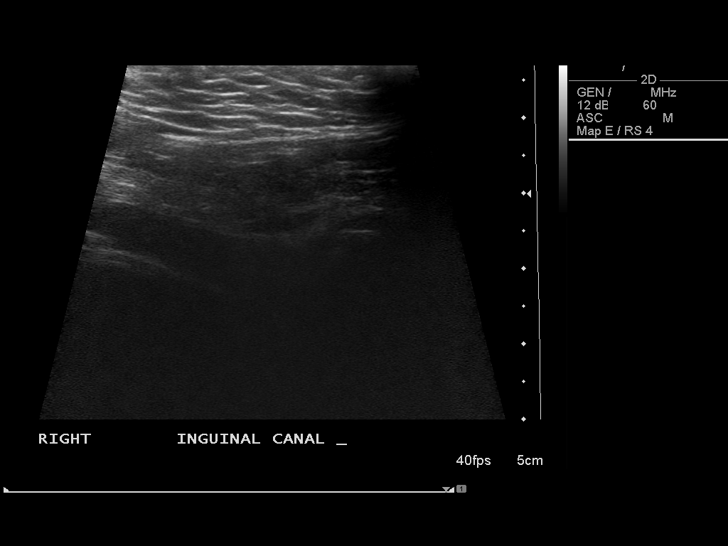
[im 28/52]
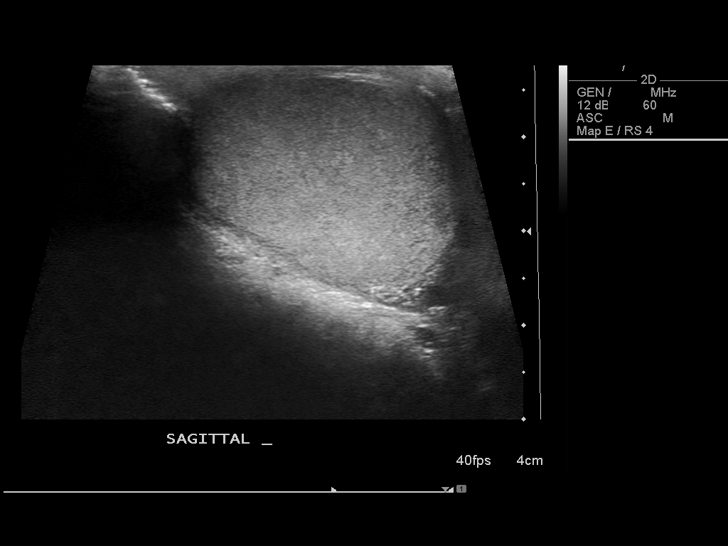
[im 32/52]
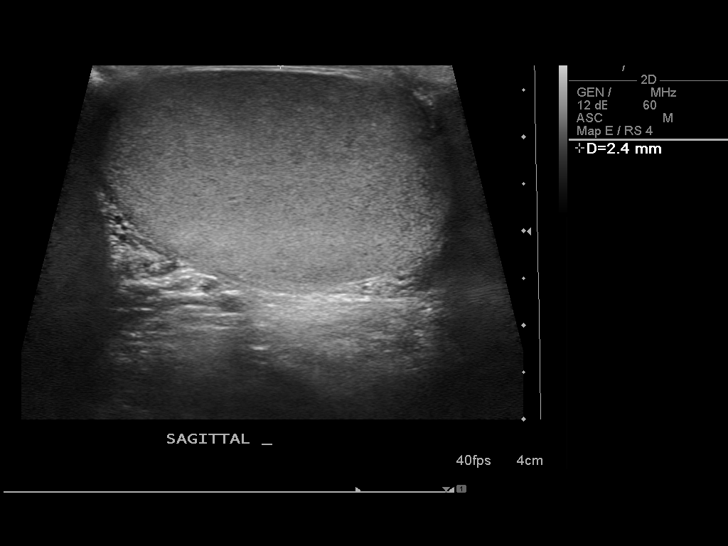
[im 35/52]
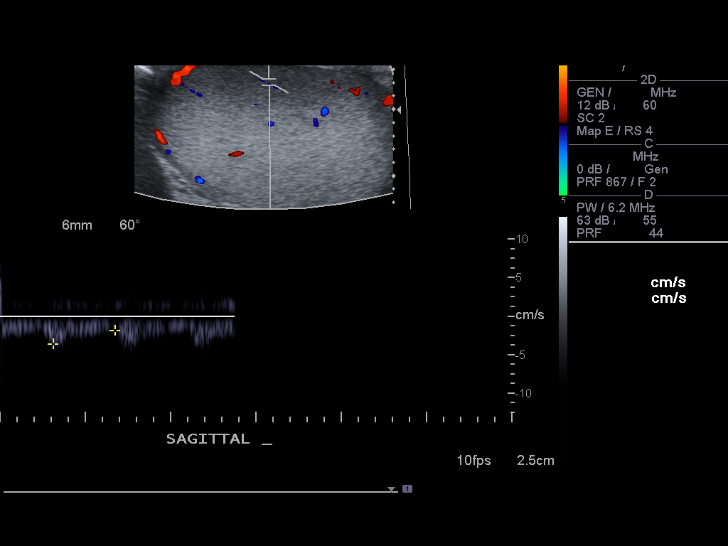
[im 39/52]
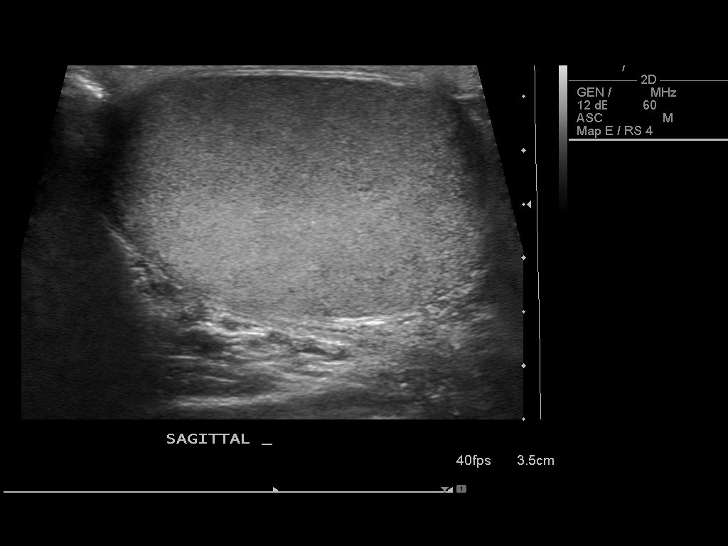
[im 43/52]
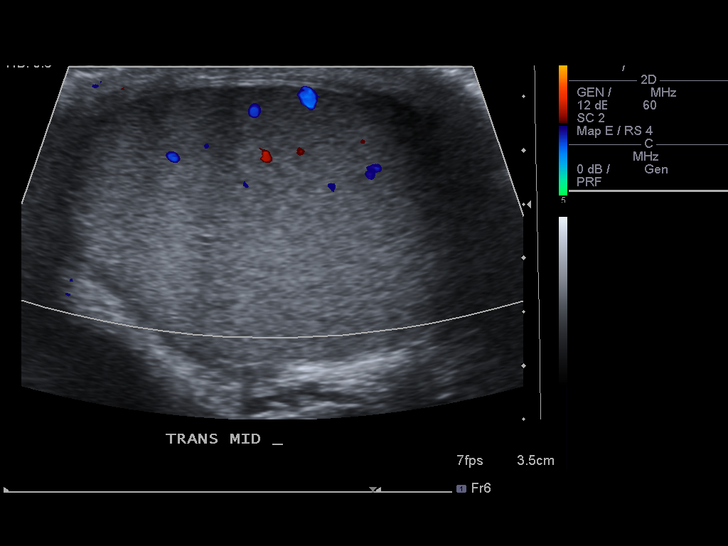
[im 47/52]
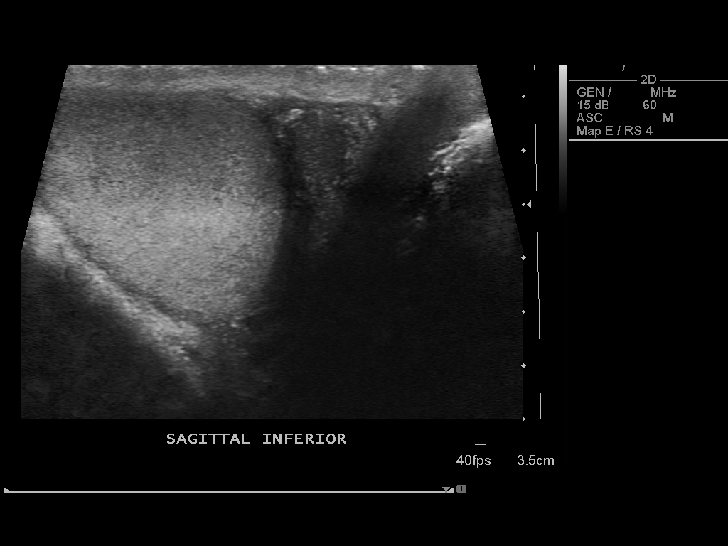
[im 52/52]
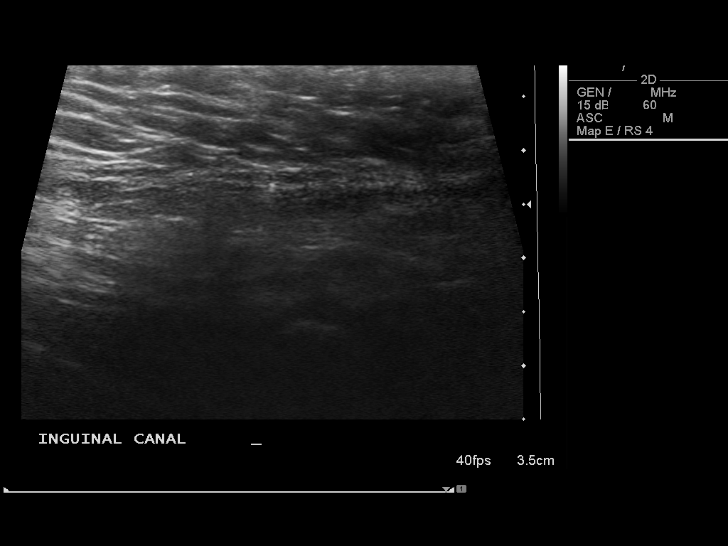

[14 of 25 positions shown; findings below may reference images not displayed]

FINDINGS: Right testis:  4.8 x 2.6 x 3.5 cm.  Normal in echotexture and
appearance.

Left testis:  4.1 x 2.3 x 3.9 cm.  Normal in echotexture and
appearance.

Right epididymis:  Appears diffusely enlarged measuring 2.7 x 1.6 x
2.9 cm with hypervascular flow on color Doppler imaging.

Left epididymis:  Normal in size measuring 1.2 x 1.0 x 0.5 cm, with
normal flow on color Doppler imaging.

Hydocele:  Trace right-sided hydrocele.  No left-sided hydrocele.

Varicocele:  Absent

Pulsed Doppler interrogation of both testes demonstrates low
resistance flow bilaterally.
IMPRESSION: 1.  Findings, as above, consistent with right-sided epididymitis
with a trace right-sided hydrocele.
2.  The appearance of the testicles is normal bilaterally.

## 2014-04-25 ENCOUNTER — Telehealth: Payer: Self-pay | Admitting: *Deleted

## 2014-04-25 NOTE — Telephone Encounter (Signed)
Brendan's mom called to say the "he feels like he needs to have blood work done."  Can he have labs done without seeing the doctor, then see Dr. Arbutus PedMohamed if the labs warrant a visit?  Please advise.

## 2014-04-25 NOTE — Telephone Encounter (Signed)
I called and spoke to [name] and he stated "I feel fine". He denied having any problems . "I need to get my blood drawn for insurance."  I instructed him to call PCP to request blood draw. He voices understanding.

## 2016-03-31 DIAGNOSIS — S83249A Other tear of medial meniscus, current injury, unspecified knee, initial encounter: Secondary | ICD-10-CM

## 2016-03-31 HISTORY — DX: Other tear of medial meniscus, current injury, unspecified knee, initial encounter: S83.249A

## 2016-04-04 ENCOUNTER — Encounter (HOSPITAL_BASED_OUTPATIENT_CLINIC_OR_DEPARTMENT_OTHER): Payer: Self-pay | Admitting: *Deleted

## 2016-04-04 ENCOUNTER — Other Ambulatory Visit: Payer: Self-pay | Admitting: Orthopedic Surgery

## 2016-04-05 NOTE — H&P (Signed)
Daryl Mejia is an 26 y.o. male.    Chief Complaint: Left Knee Pain  HPI: Patient is here today for follow-up on left knee pain.  He was last seen on 03/01/16 at which time he was diagnosed with a left knee medial meniscal tear by Dr. Althea Charon.  He did have an MRI showing a meniscal tear.  He states that he is unable to do his normal job with his knee in its current condition.  He denies any new injuries.  He denies any fevers chills night sweats or other signs of infection.  Recall, he was injured on 01/04/16 when working.  He has tried physical therapy with only minimal relief.  He rates his pain as a 3-4 out of 10 at rest and a 7-8 out of 10 when in use. The knee gets caught or stuck.  He is here today to discuss knee arthroscopy.  Past Medical History:  Diagnosis Date  . History of ITP    released from hematologist 2014  . Medial meniscus tear 03/2016   left knee    Past Surgical History:  Procedure Laterality Date  . KNEE ARTHROSCOPY Right    x 2  . PILONIDAL CYST EXCISION  08/03/2010    History reviewed. No pertinent family history. Social History:  reports that he has been smoking Cigarettes.  He has a 2.50 pack-year smoking history. He has never used smokeless tobacco. He reports that he drinks alcohol. He reports that he does not use drugs.  Allergies:  Allergies  Allergen Reactions  . Promethazine Hcl Hives    No prescriptions prior to admission.    No results found for this or any previous visit (from the past 48 hour(s)). No results found.  Review of Systems  Constitutional: Negative.   HENT: Negative.   Eyes: Negative.   Respiratory: Negative.   Cardiovascular: Negative.   Gastrointestinal: Negative.   Genitourinary: Negative.   Musculoskeletal: Positive for joint pain.  Skin: Negative.   Neurological: Negative.   Endo/Heme/Allergies: Negative.   Psychiatric/Behavioral: Negative.     Height 5\' 10"  (1.778 m), weight 85.3 kg (188 lb). Physical Exam   Constitutional: He is oriented to person, place, and time. He appears well-developed and well-nourished.  HENT:  Head: Normocephalic and atraumatic.  Eyes: Pupils are equal, round, and reactive to light.  Neck: Normal range of motion. Neck supple.  Cardiovascular: Intact distal pulses.   Respiratory: Effort normal.  Musculoskeletal: He exhibits tenderness.  the patient does have tenderness over the medial joint line.  He has pain with McMurray's test.  No instability or swelling.  His calves are soft and nontender.  He is neurovascularly intact distally.  Neurological: He is alert and oriented to person, place, and time.  Skin: Skin is warm and dry.  Psychiatric: He has a normal mood and affect. His behavior is normal. Judgment and thought content normal.     Assessment/Plan  Assess: Left knee medial meniscal tear in a patient who was injured at work and is unable to do his normal work  Plan: Treatment options are discussed with the patient.  Today, he wishes to consider knee arthroscopy.  We are formally asking Worker's Comp. for approval for a left knee arthroscopy.  Again he has a medial meniscal tear that catches and causes him significant pain.  He is unable to do his normal job and this condition.  The benefits, risks and potential complications of surgery are discussed.  We anticipate seeing this patient back  at time of surgical intervention.  He is to return sooner if needed.  Call with any issues.  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  Kirby Cortese R, PA-C 04/05/2016, 8:08 AM

## 2016-04-06 ENCOUNTER — Encounter (HOSPITAL_BASED_OUTPATIENT_CLINIC_OR_DEPARTMENT_OTHER)
Admission: RE | Disposition: A | Discharge status: Home / Self Care | Payer: Self-pay | Source: Ambulatory Visit | Attending: Orthopedic Surgery

## 2016-04-06 ENCOUNTER — Ambulatory Visit (HOSPITAL_BASED_OUTPATIENT_CLINIC_OR_DEPARTMENT_OTHER): Payer: Worker's Compensation | Admitting: Anesthesiology

## 2016-04-06 ENCOUNTER — Encounter (HOSPITAL_BASED_OUTPATIENT_CLINIC_OR_DEPARTMENT_OTHER): Payer: Self-pay | Admitting: *Deleted

## 2016-04-06 ENCOUNTER — Ambulatory Visit (HOSPITAL_BASED_OUTPATIENT_CLINIC_OR_DEPARTMENT_OTHER)
Admission: RE | Admit: 2016-04-06 | Discharge: 2016-04-06 | Disposition: A | Discharge status: Home / Self Care | Payer: Worker's Compensation | Source: Ambulatory Visit | Attending: Orthopedic Surgery | Admitting: Orthopedic Surgery

## 2016-04-06 DIAGNOSIS — Z862 Personal history of diseases of the blood and blood-forming organs and certain disorders involving the immune mechanism: Secondary | ICD-10-CM | POA: Diagnosis not present

## 2016-04-06 DIAGNOSIS — Z9889 Other specified postprocedural states: Secondary | ICD-10-CM | POA: Diagnosis not present

## 2016-04-06 DIAGNOSIS — M23222 Derangement of posterior horn of medial meniscus due to old tear or injury, left knee: Secondary | ICD-10-CM | POA: Diagnosis not present

## 2016-04-06 DIAGNOSIS — F17210 Nicotine dependence, cigarettes, uncomplicated: Secondary | ICD-10-CM | POA: Insufficient documentation

## 2016-04-06 DIAGNOSIS — S83242A Other tear of medial meniscus, current injury, left knee, initial encounter: Secondary | ICD-10-CM

## 2016-04-06 DIAGNOSIS — Z888 Allergy status to other drugs, medicaments and biological substances status: Secondary | ICD-10-CM | POA: Diagnosis not present

## 2016-04-06 HISTORY — DX: Other tear of medial meniscus, current injury, unspecified knee, initial encounter: S83.249A

## 2016-04-06 HISTORY — DX: Personal history of diseases of the blood and blood-forming organs and certain disorders involving the immune mechanism: Z86.2

## 2016-04-06 HISTORY — PX: KNEE ARTHROSCOPY: SHX127

## 2016-04-06 SURGERY — ARTHROSCOPY, KNEE
Anesthesia: General | Site: Knee | Laterality: Left

## 2016-04-06 MED ORDER — HYDROMORPHONE HCL 1 MG/ML IJ SOLN
INTRAMUSCULAR | Status: AC
  Filled 2016-04-06: qty 1

## 2016-04-06 MED ORDER — CHLORHEXIDINE GLUCONATE 4 % EX LIQD: 60.0000 mL | Freq: Once | CUTANEOUS | Status: DC

## 2016-04-06 MED ORDER — OXYCODONE HCL 5 MG PO TABS
ORAL_TABLET | ORAL | Status: AC
  Filled 2016-04-06: qty 1

## 2016-04-06 MED ORDER — EPINEPHRINE PF 1 MG/ML IJ SOLN
INTRAMUSCULAR | Status: DC | PRN
  Administered 2016-04-06: 2 mL

## 2016-04-06 MED ORDER — ONDANSETRON HCL 4 MG/2ML IJ SOLN
INTRAMUSCULAR | Status: DC | PRN
  Administered 2016-04-06: 4 mg via INTRAVENOUS

## 2016-04-06 MED ORDER — LIDOCAINE 2% (20 MG/ML) 5 ML SYRINGE
INTRAMUSCULAR | Status: DC | PRN
  Administered 2016-04-06: 100 mg via INTRAVENOUS

## 2016-04-06 MED ORDER — CEFAZOLIN SODIUM-DEXTROSE 2-4 GM/100ML-% IV SOLN
2.0000 g | INTRAVENOUS | Status: AC
  Administered 2016-04-06: 2 g via INTRAVENOUS

## 2016-04-06 MED ORDER — FENTANYL CITRATE (PF) 100 MCG/2ML IJ SOLN
INTRAMUSCULAR | Status: AC
  Filled 2016-04-06: qty 2

## 2016-04-06 MED ORDER — CEFAZOLIN SODIUM-DEXTROSE 2-4 GM/100ML-% IV SOLN
INTRAVENOUS | Status: AC
  Filled 2016-04-06: qty 100

## 2016-04-06 MED ORDER — HYDROMORPHONE HCL 1 MG/ML IJ SOLN
0.2500 mg | INTRAMUSCULAR | Status: DC | PRN
  Administered 2016-04-06 (×3): 0.5 mg via INTRAVENOUS

## 2016-04-06 MED ORDER — BUPIVACAINE-EPINEPHRINE 0.5% -1:200000 IJ SOLN
INTRAMUSCULAR | Status: DC | PRN
  Administered 2016-04-06: 20 mL

## 2016-04-06 MED ORDER — ONDANSETRON HCL 4 MG/2ML IJ SOLN
INTRAMUSCULAR | Status: AC
  Filled 2016-04-06: qty 2

## 2016-04-06 MED ORDER — DEXAMETHASONE SODIUM PHOSPHATE 10 MG/ML IJ SOLN
INTRAMUSCULAR | Status: DC | PRN
  Administered 2016-04-06: 10 mg via INTRAVENOUS

## 2016-04-06 MED ORDER — HYDROCODONE-ACETAMINOPHEN 5-325 MG PO TABS: 1.0000 | ORAL_TABLET | ORAL | 0 refills | Status: AC | PRN

## 2016-04-06 MED ORDER — MIDAZOLAM HCL 2 MG/2ML IJ SOLN
INTRAMUSCULAR | Status: AC
  Filled 2016-04-06: qty 2

## 2016-04-06 MED ORDER — PROPOFOL 10 MG/ML IV BOLUS
INTRAVENOUS | Status: DC | PRN
  Administered 2016-04-06: 200 mg via INTRAVENOUS

## 2016-04-06 MED ORDER — FENTANYL CITRATE (PF) 100 MCG/2ML IJ SOLN
50.0000 ug | INTRAMUSCULAR | Status: AC | PRN
  Administered 2016-04-06 (×2): 50 ug via INTRAVENOUS
  Administered 2016-04-06: 100 ug via INTRAVENOUS

## 2016-04-06 MED ORDER — DEXAMETHASONE SODIUM PHOSPHATE 10 MG/ML IJ SOLN
INTRAMUSCULAR | Status: AC
  Filled 2016-04-06: qty 1

## 2016-04-06 MED ORDER — MIDAZOLAM HCL 2 MG/2ML IJ SOLN
1.0000 mg | INTRAMUSCULAR | Status: DC | PRN
  Administered 2016-04-06: 2 mg via INTRAVENOUS

## 2016-04-06 MED ORDER — LACTATED RINGERS IV SOLN
INTRAVENOUS | Status: DC
  Administered 2016-04-06 (×2): via INTRAVENOUS

## 2016-04-06 MED ORDER — DEXTROSE-NACL 5-0.45 % IV SOLN: INTRAVENOUS | Status: DC

## 2016-04-06 MED ORDER — OXYCODONE HCL 5 MG PO TABS
5.0000 mg | ORAL_TABLET | Freq: Once | ORAL | Status: AC
Start: 2016-04-06 — End: 2016-04-06
  Administered 2016-04-06: 5 mg via ORAL

## 2016-04-06 MED ORDER — LIDOCAINE 2% (20 MG/ML) 5 ML SYRINGE
INTRAMUSCULAR | Status: AC
  Filled 2016-04-06: qty 5

## 2016-04-06 MED ORDER — SCOPOLAMINE 1 MG/3DAYS TD PT72: 1.0000 | MEDICATED_PATCH | Freq: Once | TRANSDERMAL | Status: DC | PRN

## 2016-04-06 MED ORDER — METOCLOPRAMIDE HCL 5 MG/ML IJ SOLN: 10.0000 mg | Freq: Once | INTRAMUSCULAR | Status: DC | PRN

## 2016-04-06 SURGICAL SUPPLY — 41 items
BANDAGE ACE 6X5 VEL STRL LF (GAUZE/BANDAGES/DRESSINGS) ×3 IMPLANT
BLADE 4.2CUDA (BLADE) IMPLANT
BLADE CUTTER GATOR 3.5 (BLADE) ×2 IMPLANT
BLADE GREAT WHITE 4.2 (BLADE) IMPLANT
BLADE GREAT WHITE 4.2MM (BLADE)
BNDG COHESIVE 6X5 TAN STRL LF (GAUZE/BANDAGES/DRESSINGS) ×3 IMPLANT
DRAPE ARTHROSCOPY W/POUCH 114 (DRAPES) ×3 IMPLANT
DRSG PAD ABDOMINAL 8X10 ST (GAUZE/BANDAGES/DRESSINGS) ×2 IMPLANT
DURAPREP 26ML APPLICATOR (WOUND CARE) ×3 IMPLANT
ELECT MENISCUS 165MM 90D (ELECTRODE) IMPLANT
ELECT REM PT RETURN 9FT ADLT (ELECTROSURGICAL)
ELECTRODE REM PT RTRN 9FT ADLT (ELECTROSURGICAL) IMPLANT
GAUZE SPONGE 2X2 12PLY NS (GAUZE/BANDAGES/DRESSINGS) ×3 IMPLANT
GAUZE SPONGE 4X4 12PLY STRL (GAUZE/BANDAGES/DRESSINGS) ×3 IMPLANT
GAUZE XEROFORM 1X8 LF (GAUZE/BANDAGES/DRESSINGS) ×3 IMPLANT
GLOVE BIO SURGEON STRL SZ7.5 (GLOVE) ×3 IMPLANT
GLOVE BIO SURGEON STRL SZ8.5 (GLOVE) ×3 IMPLANT
GLOVE BIOGEL PI IND STRL 8 (GLOVE) ×1 IMPLANT
GLOVE BIOGEL PI IND STRL 9 (GLOVE) ×1 IMPLANT
GLOVE BIOGEL PI INDICATOR 8 (GLOVE) ×2
GLOVE BIOGEL PI INDICATOR 9 (GLOVE) ×2
GOWN STRL REUS W/ TWL LRG LVL3 (GOWN DISPOSABLE) ×2 IMPLANT
GOWN STRL REUS W/TWL LRG LVL3 (GOWN DISPOSABLE) ×6
GOWN STRL REUS W/TWL XL LVL3 (GOWN DISPOSABLE) ×3 IMPLANT
IV NS IRRIG 3000ML ARTHROMATIC (IV SOLUTION) ×3 IMPLANT
KNEE WRAP E Z 3 GEL PACK (MISCELLANEOUS) ×3 IMPLANT
MANIFOLD NEPTUNE II (INSTRUMENTS) IMPLANT
NDL SAFETY ECLIPSE 18X1.5 (NEEDLE) ×1 IMPLANT
NEEDLE HYPO 18GX1.5 SHARP (NEEDLE) ×3
PACK ARTHROSCOPY DSU (CUSTOM PROCEDURE TRAY) ×3 IMPLANT
PACK BASIN DAY SURGERY FS (CUSTOM PROCEDURE TRAY) ×3 IMPLANT
PAD ALCOHOL SWAB (MISCELLANEOUS) ×3 IMPLANT
PENCIL BUTTON HOLSTER BLD 10FT (ELECTRODE) IMPLANT
PROBE BIPOLAR ATHRO 135MM 90D (MISCELLANEOUS) IMPLANT
SET ARTHROSCOPY TUBING (MISCELLANEOUS) ×3
SET ARTHROSCOPY TUBING LN (MISCELLANEOUS) ×1 IMPLANT
SLEEVE SCD COMPRESS KNEE MED (MISCELLANEOUS) IMPLANT
SYR 3ML 18GX1 1/2 (SYRINGE) IMPLANT
SYR 5ML LL (SYRINGE) ×3 IMPLANT
TOWEL OR 17X24 6PK STRL BLUE (TOWEL DISPOSABLE) ×3 IMPLANT
WATER STERILE IRR 1000ML POUR (IV SOLUTION) ×3 IMPLANT

## 2016-04-06 NOTE — Transfer of Care (Signed)
Immediate Anesthesia Transfer of Care Note  Patient: Daryl Mejia  Procedure(s) Performed: Procedure(s): ARTHROSCOPY KNEE (Left)  Patient Location: PACU  Anesthesia Type:General  Level of Consciousness: awake, alert  and oriented  Airway & Oxygen Therapy: Patient Spontanous Breathing and Patient connected to face mask oxygen  Post-op Assessment: Report given to RN and Post -op Vital signs reviewed and stable  Post vital signs: Reviewed and stable  Last Vitals:  Vitals:   04/06/16 1031  BP: 116/66  Pulse: 74  Resp: 16  Temp: 36.8 C    Last Pain:  Vitals:   04/06/16 1031  TempSrc: Oral  PainSc: 0-No pain         Complications: No apparent anesthesia complications

## 2016-04-06 NOTE — Anesthesia Preprocedure Evaluation (Signed)
Anesthesia Evaluation  Patient identified by MRN, date of birth, ID band Patient awake    Reviewed: Allergy & Precautions, NPO status , Patient's Chart, lab work & pertinent test results  Airway Mallampati: II  TM Distance: >3 FB Neck ROM: Full    Dental  (+) Dental Advisory Given   Pulmonary Current Smoker,    breath sounds clear to auscultation       Cardiovascular negative cardio ROS   Rhythm:Regular Rate:Normal     Neuro/Psych negative neurological ROS     GI/Hepatic negative GI ROS, Neg liver ROS,   Endo/Other  negative endocrine ROS  Renal/GU negative Renal ROS     Musculoskeletal   Abdominal   Peds  Hematology  (+) Blood dyscrasia (Hx of ITP. Released from hematologist 3 years ago.), ,   Anesthesia Other Findings   Reproductive/Obstetrics                             Anesthesia Physical Anesthesia Plan  ASA: I  Anesthesia Plan: General   Post-op Pain Management:    Induction: Intravenous  Airway Management Planned: LMA  Additional Equipment:   Intra-op Plan:   Post-operative Plan: Extubation in OR  Informed Consent: I have reviewed the patients History and Physical, chart, labs and discussed the procedure including the risks, benefits and alternatives for the proposed anesthesia with the patient or authorized representative who has indicated his/her understanding and acceptance.     Plan Discussed with: CRNA  Anesthesia Plan Comments:         Anesthesia Quick Evaluation

## 2016-04-06 NOTE — Discharge Instructions (Signed)

## 2016-04-06 NOTE — Anesthesia Postprocedure Evaluation (Signed)
Anesthesia Post Note  Patient: Daryl Mejia  Procedure(s) Performed: Procedure(s) (LRB): ARTHROSCOPY KNEE (Left)  Patient location during evaluation: PACU Anesthesia Type: General Level of consciousness: awake and alert Pain management: pain level controlled Vital Signs Assessment: post-procedure vital signs reviewed and stable Respiratory status: spontaneous breathing, nonlabored ventilation, respiratory function stable and patient connected to nasal cannula oxygen Cardiovascular status: blood pressure returned to baseline and stable Postop Assessment: no signs of nausea or vomiting Anesthetic complications: no       Last Vitals:  Vitals:   04/06/16 1200 04/06/16 1215  BP: 128/83 131/75  Pulse: 91 87  Resp: (!) 21 (!) 24  Temp:      Last Pain:  Vitals:   04/06/16 1215  TempSrc:   PainSc: 7                  Kennieth RadFitzgerald, Sarae Nicholes E

## 2016-04-06 NOTE — Op Note (Signed)
Pre-Op Dx: L Knee MMT  Postop BJ:YNWGx:Same   Procedure: Left knee partial arthroscopic medial meniscectomy  Surgeon: Feliberto GottronFrank J. Turner Danielsowan M.D.  Assist: Tomi LikensEric K. Gaylene BrooksPhillips PA-C  (present throughout entire procedure and necessary for timely completion of the procedure) Anes: General LMA  EBL: Minimal  Fluids: 800 cc   Indications: Catching popping and pain in the left knee MRI scan shows posterior horn medial meniscal tear. Pt has failed conservative treatment with anti-inflammatory medicines, physical therapy, and modified activites but did get good temporarily from an intra-articular cortisone injection. Pain has recurred and patient desires elective arthroscopic evaluation and treatment of knee. Risks and benefits of surgery have been discussed and questions answered.  Procedure: Patient identified by arm band and taken to the operating room at the day surgery Center. The appropriate anesthetic monitors were attached, and General LMA anesthesia was induced without difficulty. Lateral post was applied to the table and the lower extremity was prepped and draped in usual sterile fashion from the ankle to the midthigh. Time out procedure was performed. We began the operation by making standard inferior lateral and inferior medial peripatellar portals with a #11 blade allowing introduction of the arthroscope through the inferior lateral portal and the out flow to the inferior medial portal. Pump pressure was set at 100 mmHg and diagnostic arthroscopy  revealed the patellofemoral joint was pristine the lateral compartment was also in excellent condition as were the cruciate ligaments moving into the medial compartment the posterior horn of the medial meniscus had a radial parrot-beak tear that was easily displaced with a probe. The tear was removed with straight small biter and a 35 Gator sucker shaver smoothing off any rough edges. The knee was irrigated out normal saline solution. A dressing of xerofoam 4 x 4 dressing  sponges, web roll and an Ace wrap was applied. The patient was awakened extubated and taken to the recovery without difficulty.    Signed: Nestor LewandowskyFrank J Danialle Dement, MD

## 2016-04-06 NOTE — Interval H&P Note (Signed)
History and Physical Interval Note:  04/06/2016 11:12 AM  Daryl Mejia  has presented today for surgery, with the diagnosis of LEFT KNEE MEDIAL MENISCAL TEAR  The various methods of treatment have been discussed with the patient and family. After consideration of risks, benefits and other options for treatment, the patient has consented to  Procedure(s): ARTHROSCOPY KNEE (Left) as a surgical intervention .  The patient's history has been reviewed, patient examined, no change in status, stable for surgery.  I have reviewed the patient's chart and labs.  Questions were answered to the patient's satisfaction.     Nestor LewandowskyOWAN,Marc Sivertsen J

## 2016-04-06 NOTE — Anesthesia Procedure Notes (Signed)
Procedure Name: LMA Insertion Date/Time: 04/06/2016 11:19 AM Performed by: Caren MacadamARTER, Vito Beg W Pre-anesthesia Checklist: Patient identified, Emergency Drugs available, Suction available and Patient being monitored Patient Re-evaluated:Patient Re-evaluated prior to inductionOxygen Delivery Method: Circle system utilized Preoxygenation: Pre-oxygenation with 100% oxygen Intubation Type: IV induction Ventilation: Mask ventilation without difficulty LMA: LMA inserted LMA Size: 5.0 Number of attempts: 1 Airway Equipment and Method: Bite block Placement Confirmation: positive ETCO2 and breath sounds checked- equal and bilateral Tube secured with: Tape Dental Injury: Teeth and Oropharynx as per pre-operative assessment

## 2016-04-07 ENCOUNTER — Encounter (HOSPITAL_BASED_OUTPATIENT_CLINIC_OR_DEPARTMENT_OTHER): Payer: Self-pay | Admitting: Orthopedic Surgery

## 2019-03-06 ENCOUNTER — Ambulatory Visit: Payer: Managed Care, Other (non HMO) | Attending: Internal Medicine

## 2019-03-06 DIAGNOSIS — Z20822 Contact with and (suspected) exposure to covid-19: Secondary | ICD-10-CM

## 2019-03-08 LAB — NOVEL CORONAVIRUS, NAA: SARS-CoV-2, NAA: NOT DETECTED

## 2019-12-02 ENCOUNTER — Encounter: Payer: Self-pay | Admitting: Orthopedic Surgery

## 2019-12-02 ENCOUNTER — Ambulatory Visit (INDEPENDENT_AMBULATORY_CARE_PROVIDER_SITE_OTHER): Payer: Managed Care, Other (non HMO) | Admitting: Orthopedic Surgery

## 2019-12-02 ENCOUNTER — Other Ambulatory Visit: Payer: Self-pay

## 2019-12-02 DIAGNOSIS — M25561 Pain in right knee: Secondary | ICD-10-CM

## 2019-12-02 NOTE — Progress Notes (Signed)
Office Visit Note   Patient: Daryl Mejia           Date of Birth: 1990/07/23           MRN: [id number] Visit Date: 12/02/2019 Requested by: Deatra James, MD [phone] [address] [place],  [place] [address] PCP: Deatra James, MD  Subjective: Chief Complaint  Patient presents with  . follow up right knee    HPI: Daryl Mejia is a 29 year old patient with history of right knee surgery done in 2012.  Those old notes are reviewed.  He had meniscal surgery and a little bit of arthrofibrosis after that which was treated.  He is done very well since that time.  It is essentially does unrestricted physical activity.  He is very fit.  No issues with the knee in terms of limiting his activity or having any symptoms.              ROS: All systems reviewed are negative as they relate to the chief complaint within the history of present illness.  Patient denies  fevers or chills.   Assessment & Plan: Visit Diagnoses:  1. Right knee pain, unspecified chronicity     Plan: Impression is functional right knee with no residual effects from surgery done approximately 9 years ago.  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  Follow-up as needed  Follow-Up Instructions: Return if symptoms worsen or fail to improve.   Orders:  No orders of the defined types were placed in this encounter.  No orders of the defined types were placed in this encounter.     Procedures: No procedures performed   Clinical Data: No additional findings.  Objective: Vital Signs: There were no vitals taken for this visit.  Physical Exam:   Constitutional: Patient appears well-developed HEENT:  Head: Normocephalic Eyes:EOM are normal Neck: Normal range of motion Cardiovascular: Normal rate Pulmonary/chest: Effort normal Neurologic: Patient is alert Skin: Skin is  warm Psychiatric: Patient has normal mood and affect    Ortho Exam: Ortho exam demonstrates full active and passive range of motion of the right knee.  He has stable collateral cruciate ligaments.  No effusion.  Intact extensor mechanism.  Normal alignment.  Specialty Comments:  No specialty comments available.  Imaging: No results found.   PMFS History: Patient Active Problem List   Diagnosis Date Noted  . Idiopathic thrombocytopenic purpura (HCC) 05/10/2011  . Visit for wound check 09/09/2010   Past Medical History:  Diagnosis Date  . History of ITP    released from hematologist 2014  . Medial meniscus tear 03/2016   left knee    History reviewed. No pertinent family history.  Past Surgical History:  Procedure Laterality Date  . KNEE ARTHROSCOPY Right    x 2  . KNEE ARTHROSCOPY Left 04/06/2016   Procedure: ARTHROSCOPY KNEE;  Surgeon: Gean Birchwood, MD;  Location: [facility];  Service: Orthopedics;  Laterality: Left;  . PILONIDAL CYST EXCISION  08/03/2010   Social History   Occupational History  . Not on file  Tobacco Use  . Smoking status: Current Every Day Smoker    Packs/day: 0.50    Years: 5.00    Pack years: 2.50    Types: Cigarettes  . Smokeless tobacco: Never Used  Substance and Sexual Activity  . Alcohol use: Yes  Comment: occasionally  . Drug use: No  . Sexual activity: Not on file

## 2019-12-24 ENCOUNTER — Telehealth: Payer: Self-pay | Admitting: Allergy & Immunology

## 2019-12-24 NOTE — Telephone Encounter (Signed)
Please advise 

## 2019-12-24 NOTE — Telephone Encounter (Signed)
Referral sent from [facility], Daryl Mejia for a graded COVID vaccine. Patient had a presyncopal episode with the first vaccine.  Please advise if component testing is needed or just a graded vaccine challenge.

## 2019-12-25 NOTE — Telephone Encounter (Signed)
Would you  mind contacting this patient to have him scheduled for a graded vaccine for the next vaccine clinic?

## 2019-12-25 NOTE — Telephone Encounter (Signed)
Just a graded vaccine challenge. Thank you! Please schedule, next available vaccine clinic. Can you confirm which vaccination he received.  Malachi Bonds, MD [facility]

## 2019-12-26 NOTE — Telephone Encounter (Signed)
Spoke to patient regarding scheduling graded COVID vaccine. Patient is interested, but he has to look at his work schedule as he travels for work. Patient will call back to schedule.

## 2022-10-27 ENCOUNTER — Ambulatory Visit
Admission: RE | Admit: 2022-10-27 | Discharge: 2022-10-27 | Disposition: A | Discharge status: Home / Self Care | Payer: No Typology Code available for payment source | Source: Ambulatory Visit | Attending: Nurse Practitioner | Admitting: Nurse Practitioner

## 2022-10-27 ENCOUNTER — Other Ambulatory Visit: Payer: Self-pay | Admitting: Nurse Practitioner

## 2022-10-27 DIAGNOSIS — Z021 Encounter for pre-employment examination: Secondary | ICD-10-CM

## 2023-05-10 ENCOUNTER — Ambulatory Visit
Admission: RE | Admit: 2023-05-10 | Discharge: 2023-05-10 | Disposition: A | Discharge status: Home / Self Care | Source: Ambulatory Visit | Attending: Nurse Practitioner | Admitting: Nurse Practitioner

## 2023-05-10 ENCOUNTER — Other Ambulatory Visit: Payer: Self-pay | Admitting: Nurse Practitioner

## 2023-05-10 DIAGNOSIS — S99922A Unspecified injury of left foot, initial encounter: Secondary | ICD-10-CM

## 2023-05-10 DIAGNOSIS — S99912A Unspecified injury of left ankle, initial encounter: Secondary | ICD-10-CM

## 2023-05-15 ENCOUNTER — Ambulatory Visit
Admission: RE | Admit: 2023-05-15 | Discharge: 2023-05-15 | Disposition: A | Discharge status: Home / Self Care | Source: Ambulatory Visit | Attending: Nurse Practitioner | Admitting: Nurse Practitioner

## 2023-05-15 ENCOUNTER — Other Ambulatory Visit: Payer: Self-pay | Admitting: Nurse Practitioner

## 2023-05-15 DIAGNOSIS — R609 Edema, unspecified: Secondary | ICD-10-CM

## 2023-07-05 ENCOUNTER — Other Ambulatory Visit: Payer: Self-pay | Admitting: Nurse Practitioner

## 2023-07-05 ENCOUNTER — Ambulatory Visit
Admission: RE | Admit: 2023-07-05 | Discharge: 2023-07-05 | Disposition: A | Discharge status: Home / Self Care | Source: Ambulatory Visit | Attending: Nurse Practitioner | Admitting: Nurse Practitioner

## 2023-07-05 DIAGNOSIS — T1490XA Injury, unspecified, initial encounter: Secondary | ICD-10-CM

## 2024-03-27 ENCOUNTER — Other Ambulatory Visit (HOSPITAL_BASED_OUTPATIENT_CLINIC_OR_DEPARTMENT_OTHER): Payer: Self-pay | Admitting: Family Medicine

## 2024-03-27 DIAGNOSIS — Z8249 Family history of ischemic heart disease and other diseases of the circulatory system: Secondary | ICD-10-CM

## 2024-04-24 ENCOUNTER — Other Ambulatory Visit (HOSPITAL_BASED_OUTPATIENT_CLINIC_OR_DEPARTMENT_OTHER): Payer: Self-pay
